# Patient Record
Sex: Male | Born: 1950 | Race: White | Hispanic: No | Marital: Married
Health system: Southern US, Community
[De-identification: ages and names within clinical notes are randomized; demographics above are authoritative.]

---

## 2001-05-24 ENCOUNTER — Encounter: Admission: RE | Admit: 2001-05-24 | Discharge: 2001-05-24 | Payer: Self-pay

## 2004-07-07 ENCOUNTER — Emergency Department: Payer: Self-pay | Admitting: Emergency Medicine

## 2011-06-27 ENCOUNTER — Inpatient Hospital Stay: Payer: Self-pay | Admitting: Internal Medicine

## 2011-09-18 LAB — COMPREHENSIVE METABOLIC PANEL
Albumin: 3.8 g/dL (ref 3.4–5.0)
Alkaline Phosphatase: 87 U/L (ref 50–136)
Anion Gap: 13 (ref 7–16)
BUN: 21 mg/dL — ABNORMAL HIGH (ref 7–18)
Glucose: 251 mg/dL — ABNORMAL HIGH (ref 65–99)
Osmolality: 282 (ref 275–301)
Potassium: 4 mmol/L (ref 3.5–5.1)
SGOT(AST): 17 U/L (ref 15–37)
Sodium: 135 mmol/L — ABNORMAL LOW (ref 136–145)
Total Protein: 8.2 g/dL (ref 6.4–8.2)

## 2011-09-18 LAB — CBC
HGB: 12.8 g/dL — ABNORMAL LOW (ref 13.0–18.0)
MCH: 28.4 pg (ref 26.0–34.0)
MCHC: 32.4 g/dL (ref 32.0–36.0)
RBC: 4.48 10*6/uL (ref 4.40–5.90)
RDW: 14.1 % (ref 11.5–14.5)
WBC: 10.7 10*3/uL — ABNORMAL HIGH (ref 3.8–10.6)

## 2011-09-19 ENCOUNTER — Inpatient Hospital Stay: Payer: Self-pay | Admitting: Student

## 2011-09-19 LAB — PROTIME-INR
INR: 1.2
Prothrombin Time: 15.1 secs — ABNORMAL HIGH (ref 11.5–14.7)

## 2011-09-19 LAB — URINALYSIS, COMPLETE
Bilirubin,UR: NEGATIVE
Blood: NEGATIVE
Glucose,UR: >=500 mg/dL (ref 0–75)
Ketone: NEGATIVE
Ph: 5 (ref 4.5–8.0)
Squamous Epithelial: 6
WBC UR: 5 /HPF (ref 0–5)

## 2011-09-19 LAB — TSH: Thyroid Stimulating Horm: 1.09 u[IU]/mL

## 2011-09-19 LAB — CK TOTAL AND CKMB (NOT AT ARMC)
CK, Total: 81 U/L (ref 35–232)
CK-MB: 0.9 ng/mL (ref 0.5–3.6)

## 2011-09-19 LAB — PRO B NATRIURETIC PEPTIDE: B-Type Natriuretic Peptide: 1624 pg/mL — ABNORMAL HIGH (ref 0–125)

## 2011-09-19 LAB — TROPONIN I: Troponin-I: 0.03 ng/mL

## 2011-09-20 LAB — CBC WITH DIFFERENTIAL/PLATELET
Basophil %: 0.1 %
Eosinophil %: 0.4 %
HCT: 35.3 % — ABNORMAL LOW (ref 40.0–52.0)
HGB: 11.8 g/dL — ABNORMAL LOW (ref 13.0–18.0)
Lymphocyte #: 0.6 10*3/uL — ABNORMAL LOW (ref 1.0–3.6)
MCH: 29.5 pg (ref 26.0–34.0)
MCV: 88 fL (ref 80–100)
Monocyte #: 0.9 10*3/uL — ABNORMAL HIGH (ref 0.0–0.7)
Monocyte %: 4.7 %
Neutrophil %: 91.9 %
RBC: 3.99 10*6/uL — ABNORMAL LOW (ref 4.40–5.90)

## 2011-09-20 LAB — BASIC METABOLIC PANEL
Calcium, Total: 9.1 mg/dL (ref 8.5–10.1)
Chloride: 95 mmol/L — ABNORMAL LOW (ref 98–107)
EGFR (African American): 48 — ABNORMAL LOW
Glucose: 86 mg/dL (ref 65–99)
Potassium: 4 mmol/L (ref 3.5–5.1)
Sodium: 132 mmol/L — ABNORMAL LOW (ref 136–145)

## 2011-09-21 LAB — VANCOMYCIN, TROUGH: Vancomycin, Trough: 18 ug/mL (ref 10–20)

## 2011-09-21 LAB — CBC WITH DIFFERENTIAL/PLATELET
Eosinophil #: 0.1 10*3/uL (ref 0.0–0.7)
Eosinophil %: 0.8 %
HCT: 34.7 % — ABNORMAL LOW (ref 40.0–52.0)
Lymphocyte #: 0.6 10*3/uL — ABNORMAL LOW (ref 1.0–3.6)
Lymphocyte %: 4.1 %
MCHC: 32.9 g/dL (ref 32.0–36.0)
Monocyte #: 0.8 10*3/uL — ABNORMAL HIGH (ref 0.0–0.7)
Monocyte %: 4.9 %
Neutrophil %: 90.1 %
RDW: 14.9 % — ABNORMAL HIGH (ref 11.5–14.5)
WBC: 15.5 10*3/uL — ABNORMAL HIGH (ref 3.8–10.6)

## 2011-09-21 LAB — BASIC METABOLIC PANEL
Anion Gap: 12 (ref 7–16)
BUN: 41 mg/dL — ABNORMAL HIGH (ref 7–18)
Chloride: 95 mmol/L — ABNORMAL LOW (ref 98–107)
Creatinine: 1.32 mg/dL — ABNORMAL HIGH (ref 0.60–1.30)
EGFR (Non-African Amer.): 59 — ABNORMAL LOW
Potassium: 4.1 mmol/L (ref 3.5–5.1)

## 2011-09-21 LAB — HEMOGLOBIN: HGB: 7.5 g/dL — ABNORMAL LOW (ref 13.0–18.0)

## 2011-09-21 LAB — OCCULT BLOOD X 1 CARD TO LAB, STOOL: Occult Blood, Feces: NEGATIVE

## 2011-09-22 LAB — BASIC METABOLIC PANEL
BUN: 34 mg/dL — ABNORMAL HIGH (ref 7–18)
Creatinine: 1.03 mg/dL (ref 0.60–1.30)
EGFR (African American): >60
EGFR (Non-African Amer.): >60
Glucose: 222 mg/dL — ABNORMAL HIGH (ref 65–99)
Potassium: 4.7 mmol/L (ref 3.5–5.1)
Sodium: 133 mmol/L — ABNORMAL LOW (ref 136–145)

## 2011-09-22 LAB — CBC WITH DIFFERENTIAL/PLATELET
Basophil %: 0.3 %
Eosinophil #: 0.1 10*3/uL (ref 0.0–0.7)
Eosinophil %: 0.5 %
HCT: 19.9 % — ABNORMAL LOW (ref 40.0–52.0)
HGB: 6.6 g/dL — ABNORMAL LOW (ref 13.0–18.0)
MCH: 29.1 pg (ref 26.0–34.0)
MCHC: 33.1 g/dL (ref 32.0–36.0)
MCV: 88 fL (ref 80–100)
Neutrophil #: 11.5 10*3/uL — ABNORMAL HIGH (ref 1.4–6.5)
Platelet: 143 10*3/uL — ABNORMAL LOW (ref 150–440)
RBC: 2.26 10*6/uL — ABNORMAL LOW (ref 4.40–5.90)

## 2011-09-22 LAB — PROTIME-INR: Prothrombin Time: 16.1 secs — ABNORMAL HIGH (ref 11.5–14.7)

## 2011-09-23 LAB — CBC WITH DIFFERENTIAL/PLATELET
Basophil %: 0.6 %
Basophil %: 0.5 %
Eosinophil #: 0.2 10*3/uL (ref 0.0–0.7)
Eosinophil #: 0.2 10*3/uL (ref 0.0–0.7)
Eosinophil %: 1.3 %
Eosinophil %: 1.6 %
HCT: 21 % — ABNORMAL LOW (ref 40.0–52.0)
HGB: 7 g/dL — ABNORMAL LOW (ref 13.0–18.0)
HGB: 7.7 g/dL — ABNORMAL LOW (ref 13.0–18.0)
Lymphocyte #: 1.1 10*3/uL (ref 1.0–3.6)
Lymphocyte %: 9 %
Lymphocyte %: 7.6 %
MCH: 29.4 pg (ref 26.0–34.0)
MCHC: 33.3 g/dL (ref 32.0–36.0)
MCV: 88 fL (ref 80–100)
Monocyte #: 1 10*3/uL — ABNORMAL HIGH (ref 0.0–0.7)
Monocyte %: 7.7 %
Neutrophil #: 12.1 10*3/uL — ABNORMAL HIGH (ref 1.4–6.5)
Neutrophil %: 81.4 %
Neutrophil %: 82.2 %
Platelet: 169 10*3/uL (ref 150–440)
RBC: 2.38 10*6/uL — ABNORMAL LOW (ref 4.40–5.90)
RBC: 2.59 10*6/uL — ABNORMAL LOW (ref 4.40–5.90)
RDW: 15.3 % — ABNORMAL HIGH (ref 11.5–14.5)
WBC: 13.3 10*3/uL — ABNORMAL HIGH (ref 3.8–10.6)

## 2011-09-23 LAB — VANCOMYCIN, TROUGH: Vancomycin, Trough: 21 ug/mL (ref 10–20)

## 2011-09-23 LAB — BASIC METABOLIC PANEL
Anion Gap: 12 (ref 7–16)
Calcium, Total: 7.7 mg/dL — ABNORMAL LOW (ref 8.5–10.1)
Chloride: 101 mmol/L (ref 98–107)
Co2: 26 mmol/L (ref 21–32)
EGFR (Non-African Amer.): >60
Glucose: 232 mg/dL — ABNORMAL HIGH (ref 65–99)
Sodium: 139 mmol/L (ref 136–145)

## 2011-09-24 LAB — CBC WITH DIFFERENTIAL/PLATELET
Basophil %: 0.7 %
Eosinophil %: 2.7 %
HCT: 23 % — ABNORMAL LOW (ref 40.0–52.0)
HGB: 7.7 g/dL — ABNORMAL LOW (ref 13.0–18.0)
Lymphocyte %: 11.5 %
MCV: 87 fL (ref 80–100)
Neutrophil #: 10.1 10*3/uL — ABNORMAL HIGH (ref 1.4–6.5)
Neutrophil %: 77.6 %
RDW: 15.7 % — ABNORMAL HIGH (ref 11.5–14.5)

## 2011-09-24 LAB — CULTURE, BLOOD (SINGLE)

## 2011-09-25 LAB — CBC WITH DIFFERENTIAL/PLATELET
Basophil #: 0.1 10*3/uL (ref 0.0–0.1)
Eosinophil #: 0.4 10*3/uL (ref 0.0–0.7)
HCT: 23.9 % — ABNORMAL LOW (ref 40.0–52.0)
Lymphocyte #: 1.2 10*3/uL (ref 1.0–3.6)
Lymphocyte %: 10.3 %
MCHC: 33.7 g/dL (ref 32.0–36.0)
MCV: 88 fL (ref 80–100)
Neutrophil #: 9.3 10*3/uL — ABNORMAL HIGH (ref 1.4–6.5)
RDW: 15.8 % — ABNORMAL HIGH (ref 11.5–14.5)

## 2011-09-26 LAB — CBC WITH DIFFERENTIAL/PLATELET
Basophil %: 0.4 %
Eosinophil #: 0.4 10*3/uL (ref 0.0–0.7)
Eosinophil %: 3 %
HCT: 25.2 % — ABNORMAL LOW (ref 40.0–52.0)
HGB: 8.4 g/dL — ABNORMAL LOW (ref 13.0–18.0)
Lymphocyte %: 10.2 %
MCH: 29.5 pg (ref 26.0–34.0)
MCHC: 33.3 g/dL (ref 32.0–36.0)
MCV: 88 fL (ref 80–100)
Monocyte #: 0.9 10*3/uL — ABNORMAL HIGH (ref 0.0–0.7)
Neutrophil #: 11.9 10*3/uL — ABNORMAL HIGH (ref 1.4–6.5)
Platelet: 262 10*3/uL (ref 150–440)
RBC: 2.85 10*6/uL — ABNORMAL LOW (ref 4.40–5.90)

## 2011-12-08 ENCOUNTER — Encounter: Payer: Self-pay | Admitting: Family Medicine

## 2011-12-10 ENCOUNTER — Ambulatory Visit: Payer: Self-pay | Admitting: Family Medicine

## 2011-12-17 ENCOUNTER — Inpatient Hospital Stay: Payer: Self-pay | Admitting: Internal Medicine

## 2011-12-17 LAB — COMPREHENSIVE METABOLIC PANEL
Albumin: 3.2 g/dL — ABNORMAL LOW (ref 3.4–5.0)
Alkaline Phosphatase: 103 U/L (ref 50–136)
Anion Gap: 8 (ref 7–16)
BUN: 15 mg/dL (ref 7–18)
Bilirubin,Total: 0.4 mg/dL (ref 0.2–1.0)
Calcium, Total: 9.1 mg/dL (ref 8.5–10.1)
Chloride: 99 mmol/L (ref 98–107)
Co2: 29 mmol/L (ref 21–32)
Creatinine: 0.85 mg/dL (ref 0.60–1.30)
EGFR (African American): >60
EGFR (Non-African Amer.): >60
Glucose: 99 mg/dL (ref 65–99)
Osmolality: 273 (ref 275–301)
Potassium: 4.1 mmol/L (ref 3.5–5.1)
SGOT(AST): 16 U/L (ref 15–37)
SGPT (ALT): 17 U/L
Sodium: 136 mmol/L (ref 136–145)
Total Protein: 7.9 g/dL (ref 6.4–8.2)

## 2011-12-17 LAB — CK TOTAL AND CKMB (NOT AT ARMC)
CK, Total: 49 U/L (ref 35–232)
CK-MB: 1.1 ng/mL (ref 0.5–3.6)

## 2011-12-17 LAB — CBC
HCT: 31.7 % — ABNORMAL LOW (ref 40.0–52.0)
HGB: 9.9 g/dL — ABNORMAL LOW (ref 13.0–18.0)
MCH: 23.6 pg — ABNORMAL LOW (ref 26.0–34.0)
MCHC: 31.4 g/dL — ABNORMAL LOW (ref 32.0–36.0)
MCV: 75 fL — ABNORMAL LOW (ref 80–100)
Platelet: 392 10*3/uL (ref 150–440)
RBC: 4.2 10*6/uL — ABNORMAL LOW (ref 4.40–5.90)
RDW: 15.8 % — ABNORMAL HIGH (ref 11.5–14.5)
WBC: 8.6 10*3/uL (ref 3.8–10.6)

## 2011-12-17 LAB — PRO B NATRIURETIC PEPTIDE: B-Type Natriuretic Peptide: 740 pg/mL — ABNORMAL HIGH (ref 0–125)

## 2011-12-17 LAB — TROPONIN I: Troponin-I: <0.02 ng/mL

## 2011-12-18 LAB — CBC WITH DIFFERENTIAL/PLATELET
Basophil #: 0.1 10*3/uL (ref 0.0–0.1)
Basophil %: 1.1 %
Eosinophil #: 0.3 10*3/uL (ref 0.0–0.7)
Eosinophil %: 3.6 %
HCT: 30.8 % — ABNORMAL LOW (ref 40.0–52.0)
HGB: 9.8 g/dL — ABNORMAL LOW (ref 13.0–18.0)
Lymphocyte #: 1.8 10*3/uL (ref 1.0–3.6)
Lymphocyte %: 22.5 %
MCHC: 31.9 g/dL — ABNORMAL LOW (ref 32.0–36.0)
MCV: 76 fL — ABNORMAL LOW (ref 80–100)
Monocyte #: 1.2 x10 3/mm — ABNORMAL HIGH (ref 0.2–1.0)
Neutrophil #: 4.5 10*3/uL (ref 1.4–6.5)
RBC: 4.08 10*6/uL — ABNORMAL LOW (ref 4.40–5.90)

## 2011-12-23 LAB — CULTURE, BLOOD (SINGLE)

## 2011-12-24 ENCOUNTER — Inpatient Hospital Stay: Payer: Self-pay | Admitting: Specialist

## 2011-12-24 LAB — CBC WITH DIFFERENTIAL/PLATELET
Basophil #: 0.1 10*3/uL (ref 0.0–0.1)
Eosinophil %: 2 %
HCT: 31.9 % — ABNORMAL LOW (ref 40.0–52.0)
Lymphocyte #: 1.4 10*3/uL (ref 1.0–3.6)
Lymphocyte %: 13.4 %
MCV: 75 fL — ABNORMAL LOW (ref 80–100)
Monocyte #: 0.9 x10 3/mm (ref 0.2–1.0)
Neutrophil #: 7.5 10*3/uL — ABNORMAL HIGH (ref 1.4–6.5)
RBC: 4.27 10*6/uL — ABNORMAL LOW (ref 4.40–5.90)
RDW: 15.9 % — ABNORMAL HIGH (ref 11.5–14.5)
WBC: 10.1 10*3/uL (ref 3.8–10.6)

## 2011-12-24 LAB — BASIC METABOLIC PANEL
Anion Gap: 9 (ref 7–16)
BUN: 20 mg/dL — ABNORMAL HIGH (ref 7–18)
Calcium, Total: 9.2 mg/dL (ref 8.5–10.1)
Chloride: 94 mmol/L — ABNORMAL LOW (ref 98–107)
Co2: 29 mmol/L (ref 21–32)
Glucose: 111 mg/dL — ABNORMAL HIGH (ref 65–99)
Osmolality: 268 (ref 275–301)
Potassium: 4.4 mmol/L (ref 3.5–5.1)
Sodium: 132 mmol/L — ABNORMAL LOW (ref 136–145)

## 2011-12-25 LAB — BASIC METABOLIC PANEL
Anion Gap: 10 (ref 7–16)
Calcium, Total: 9.3 mg/dL (ref 8.5–10.1)
Chloride: 93 mmol/L — ABNORMAL LOW (ref 98–107)
Creatinine: 0.92 mg/dL (ref 0.60–1.30)
Glucose: 187 mg/dL — ABNORMAL HIGH (ref 65–99)
Osmolality: 269 (ref 275–301)
Potassium: 4.4 mmol/L (ref 3.5–5.1)
Sodium: 131 mmol/L — ABNORMAL LOW (ref 136–145)

## 2011-12-25 LAB — CBC WITH DIFFERENTIAL/PLATELET
Basophil #: 0.1 10*3/uL (ref 0.0–0.1)
Basophil %: 0.9 %
Lymphocyte %: 4.2 %
MCH: 24 pg — ABNORMAL LOW (ref 26.0–34.0)
Monocyte #: 0.2 x10 3/mm (ref 0.2–1.0)
Neutrophil #: 7.5 10*3/uL — ABNORMAL HIGH (ref 1.4–6.5)
Platelet: 340 10*3/uL (ref 150–440)
RDW: 15.7 % — ABNORMAL HIGH (ref 11.5–14.5)
WBC: 8.1 10*3/uL (ref 3.8–10.6)

## 2011-12-30 LAB — CULTURE, BLOOD (SINGLE)

## 2012-01-05 ENCOUNTER — Encounter: Payer: Self-pay | Admitting: Family Medicine

## 2012-01-20 ENCOUNTER — Inpatient Hospital Stay: Payer: Self-pay | Admitting: Internal Medicine

## 2012-01-20 LAB — URINALYSIS, COMPLETE
Bacteria: NONE SEEN
Bilirubin,UR: NEGATIVE
Blood: NEGATIVE
Glucose,UR: NEGATIVE mg/dL (ref 0–75)
Ketone: NEGATIVE
Leukocyte Esterase: NEGATIVE
Nitrite: NEGATIVE
Ph: 5 (ref 4.5–8.0)
Protein: NEGATIVE
RBC,UR: NONE SEEN /HPF (ref 0–5)
Specific Gravity: 1.01 (ref 1.003–1.030)
Squamous Epithelial: <1
WBC UR: <1 /HPF (ref 0–5)

## 2012-01-20 LAB — COMPREHENSIVE METABOLIC PANEL
Albumin: 3.2 g/dL — ABNORMAL LOW (ref 3.4–5.0)
Alkaline Phosphatase: 88 U/L (ref 50–136)
Anion Gap: 12 (ref 7–16)
BUN: 41 mg/dL — ABNORMAL HIGH (ref 7–18)
Bilirubin,Total: 0.7 mg/dL (ref 0.2–1.0)
Calcium, Total: 9.5 mg/dL (ref 8.5–10.1)
Chloride: 94 mmol/L — ABNORMAL LOW (ref 98–107)
Co2: 26 mmol/L (ref 21–32)
Creatinine: 1.41 mg/dL — ABNORMAL HIGH (ref 0.60–1.30)
EGFR (African American): >60
EGFR (Non-African Amer.): 53 — ABNORMAL LOW
Glucose: 187 mg/dL — ABNORMAL HIGH (ref 65–99)
Osmolality: 280 (ref 275–301)
Potassium: 4.8 mmol/L (ref 3.5–5.1)
SGOT(AST): 15 U/L (ref 15–37)
SGPT (ALT): 19 U/L
Sodium: 132 mmol/L — ABNORMAL LOW (ref 136–145)
Total Protein: 7.7 g/dL (ref 6.4–8.2)

## 2012-01-20 LAB — CBC
HCT: 30.9 % — ABNORMAL LOW (ref 40.0–52.0)
HGB: 9.4 g/dL — ABNORMAL LOW (ref 13.0–18.0)
MCH: 22.7 pg — ABNORMAL LOW (ref 26.0–34.0)
MCHC: 30.4 g/dL — ABNORMAL LOW (ref 32.0–36.0)
MCV: 75 fL — ABNORMAL LOW (ref 80–100)
Platelet: 284 10*3/uL (ref 150–440)
RBC: 4.13 10*6/uL — ABNORMAL LOW (ref 4.40–5.90)
RDW: 18.2 % — ABNORMAL HIGH (ref 11.5–14.5)
WBC: 7.9 10*3/uL (ref 3.8–10.6)

## 2012-01-20 LAB — PROTIME-INR
INR: 1.1
Prothrombin Time: 14.4 secs (ref 11.5–14.7)

## 2012-01-20 LAB — MAGNESIUM: Magnesium: 1.9 mg/dL

## 2012-01-21 LAB — BASIC METABOLIC PANEL
Anion Gap: 9 (ref 7–16)
BUN: 34 mg/dL — ABNORMAL HIGH (ref 7–18)
Creatinine: 1.26 mg/dL (ref 0.60–1.30)
EGFR (Non-African Amer.): >60
Glucose: 94 mg/dL (ref 65–99)
Osmolality: 277 (ref 275–301)

## 2012-01-21 LAB — CBC WITH DIFFERENTIAL/PLATELET
Basophil #: 0.1 10*3/uL (ref 0.0–0.1)
Eosinophil %: 3.9 %
HCT: 30.6 % — ABNORMAL LOW (ref 40.0–52.0)
HGB: 9.6 g/dL — ABNORMAL LOW (ref 13.0–18.0)
Lymphocyte %: 13.5 %
MCV: 74 fL — ABNORMAL LOW (ref 80–100)
Monocyte %: 11.8 %
Neutrophil #: 5.4 10*3/uL (ref 1.4–6.5)
Neutrophil %: 69.7 %
RBC: 4.13 10*6/uL — ABNORMAL LOW (ref 4.40–5.90)
RDW: 18.3 % — ABNORMAL HIGH (ref 11.5–14.5)

## 2012-01-24 LAB — WOUND CULTURE

## 2012-01-26 LAB — CULTURE, BLOOD (SINGLE)

## 2012-02-05 ENCOUNTER — Encounter: Payer: Self-pay | Admitting: Family Medicine

## 2012-12-05 ENCOUNTER — Ambulatory Visit: Payer: Self-pay | Admitting: Internal Medicine

## 2012-12-08 ENCOUNTER — Ambulatory Visit: Payer: Self-pay | Admitting: Pain Medicine

## 2012-12-23 ENCOUNTER — Inpatient Hospital Stay: Payer: Self-pay | Admitting: Internal Medicine

## 2012-12-23 LAB — URINALYSIS, COMPLETE
Bilirubin,UR: NEGATIVE
Blood: NEGATIVE
Glucose,UR: NEGATIVE mg/dL (ref 0–75)
Ketone: NEGATIVE
Nitrite: NEGATIVE
Ph: 5 (ref 4.5–8.0)
Protein: 30
RBC,UR: 4 /HPF (ref 0–5)
Specific Gravity: 1.018 (ref 1.003–1.030)
WBC UR: 43 /HPF (ref 0–5)

## 2012-12-23 LAB — COMPREHENSIVE METABOLIC PANEL
Alkaline Phosphatase: 116 U/L (ref 50–136)
Anion Gap: 7 (ref 7–16)
Bilirubin,Total: 0.5 mg/dL (ref 0.2–1.0)
Chloride: 106 mmol/L (ref 98–107)
EGFR (African American): 18 — ABNORMAL LOW
EGFR (Non-African Amer.): 16 — ABNORMAL LOW
Glucose: 86 mg/dL (ref 65–99)
Osmolality: 295 (ref 275–301)
Potassium: 5.8 mmol/L — ABNORMAL HIGH (ref 3.5–5.1)
SGOT(AST): 21 U/L (ref 15–37)
SGPT (ALT): 14 U/L (ref 12–78)
Total Protein: 7.5 g/dL (ref 6.4–8.2)

## 2012-12-23 LAB — TROPONIN I: Troponin-I: 0.09 ng/mL — ABNORMAL HIGH

## 2012-12-23 LAB — AMMONIA: Ammonia, Plasma: 104 mcmol/L — ABNORMAL HIGH (ref 11–32)

## 2012-12-23 LAB — CBC
HCT: 35.4 % — ABNORMAL LOW (ref 40.0–52.0)
HGB: 11.7 g/dL — ABNORMAL LOW (ref 13.0–18.0)
Platelet: 158 10*3/uL (ref 150–440)
RBC: 4.05 10*6/uL — ABNORMAL LOW (ref 4.40–5.90)
RDW: 17.4 % — ABNORMAL HIGH (ref 11.5–14.5)
WBC: 6.7 10*3/uL (ref 3.8–10.6)

## 2012-12-23 LAB — CK TOTAL AND CKMB (NOT AT ARMC): CK, Total: 54 U/L (ref 35–232)

## 2012-12-24 DIAGNOSIS — I4891 Unspecified atrial fibrillation: Secondary | ICD-10-CM

## 2012-12-24 DIAGNOSIS — R7989 Other specified abnormal findings of blood chemistry: Secondary | ICD-10-CM

## 2012-12-24 DIAGNOSIS — I359 Nonrheumatic aortic valve disorder, unspecified: Secondary | ICD-10-CM

## 2012-12-24 LAB — LIPID PANEL
HDL Cholesterol: 38 mg/dL — ABNORMAL LOW (ref 40–60)
Ldl Cholesterol, Calc: 56 mg/dL (ref 0–100)

## 2012-12-24 LAB — BASIC METABOLIC PANEL
Anion Gap: 8 (ref 7–16)
BUN: 85 mg/dL — ABNORMAL HIGH (ref 7–18)
Calcium, Total: 8.7 mg/dL (ref 8.5–10.1)
Co2: 24 mmol/L (ref 21–32)
Creatinine: 4.23 mg/dL — ABNORMAL HIGH (ref 0.60–1.30)
EGFR (African American): 17 — ABNORMAL LOW
EGFR (African American): 16 — ABNORMAL LOW
EGFR (Non-African Amer.): 14 — ABNORMAL LOW
Glucose: 180 mg/dL — ABNORMAL HIGH (ref 65–99)
Osmolality: 299 (ref 275–301)
Osmolality: 296 (ref 275–301)
Sodium: 135 mmol/L — ABNORMAL LOW (ref 136–145)
Sodium: 132 mmol/L — ABNORMAL LOW (ref 136–145)

## 2012-12-24 LAB — CBC WITH DIFFERENTIAL/PLATELET
Basophil #: 0.1 10*3/uL (ref 0.0–0.1)
Basophil %: 1.3 %
Eosinophil #: 0.2 10*3/uL (ref 0.0–0.7)
HGB: 11.2 g/dL — ABNORMAL LOW (ref 13.0–18.0)
Lymphocyte %: 11.8 %
MCH: 28.5 pg (ref 26.0–34.0)
MCHC: 32.5 g/dL (ref 32.0–36.0)
MCV: 88 fL (ref 80–100)
Neutrophil #: 3.6 10*3/uL (ref 1.4–6.5)
Neutrophil %: 66.4 %
RBC: 3.93 10*6/uL — ABNORMAL LOW (ref 4.40–5.90)
RDW: 17 % — ABNORMAL HIGH (ref 11.5–14.5)

## 2012-12-24 LAB — HEMOGLOBIN A1C: Hemoglobin A1C: 6.4 % — ABNORMAL HIGH (ref 4.2–6.3)

## 2012-12-24 LAB — CK TOTAL AND CKMB (NOT AT ARMC)
CK, Total: 54 U/L (ref 35–232)
CK-MB: 4.3 ng/mL — ABNORMAL HIGH (ref 0.5–3.6)

## 2012-12-24 LAB — TROPONIN I: Troponin-I: 0.06 ng/mL — ABNORMAL HIGH

## 2012-12-24 LAB — MAGNESIUM: Magnesium: 2 mg/dL

## 2012-12-25 LAB — CBC WITH DIFFERENTIAL/PLATELET
Basophil #: 0.1 10*3/uL (ref 0.0–0.1)
Eosinophil #: 0.4 10*3/uL (ref 0.0–0.7)
Lymphocyte #: 0.4 10*3/uL — ABNORMAL LOW (ref 1.0–3.6)
Lymphocyte %: 5.3 %
MCHC: 33 g/dL (ref 32.0–36.0)
MCV: 86 fL (ref 80–100)
Monocyte #: 1 x10 3/mm (ref 0.2–1.0)
Monocyte %: 13.2 %
Neutrophil #: 5.6 10*3/uL (ref 1.4–6.5)
RBC: 3.97 10*6/uL — ABNORMAL LOW (ref 4.40–5.90)
RDW: 16.9 % — ABNORMAL HIGH (ref 11.5–14.5)

## 2012-12-25 LAB — BASIC METABOLIC PANEL
Anion Gap: 9 (ref 7–16)
BUN: 92 mg/dL — ABNORMAL HIGH (ref 7–18)
Calcium, Total: 8.6 mg/dL (ref 8.5–10.1)
Creatinine: 4.07 mg/dL — ABNORMAL HIGH (ref 0.60–1.30)
EGFR (Non-African Amer.): 15 — ABNORMAL LOW
Osmolality: 309 (ref 275–301)
Sodium: 137 mmol/L (ref 136–145)

## 2012-12-25 LAB — HEPATIC FUNCTION PANEL A (ARMC)
Alkaline Phosphatase: 96 U/L (ref 50–136)
Bilirubin, Direct: 0.4 mg/dL — ABNORMAL HIGH (ref 0.00–0.20)
Bilirubin,Total: 0.7 mg/dL (ref 0.2–1.0)
SGOT(AST): 14 U/L — ABNORMAL LOW (ref 15–37)
SGPT (ALT): 13 U/L (ref 12–78)

## 2012-12-25 LAB — TROPONIN I: Troponin-I: 0.06 ng/mL — ABNORMAL HIGH

## 2012-12-25 LAB — URINE CULTURE

## 2012-12-26 LAB — AMMONIA: Ammonia, Plasma: 34 mcmol/L — ABNORMAL HIGH (ref 11–32)

## 2012-12-26 LAB — COMPREHENSIVE METABOLIC PANEL
Alkaline Phosphatase: 83 U/L (ref 50–136)
Anion Gap: 13 (ref 7–16)
BUN: 91 mg/dL — ABNORMAL HIGH (ref 7–18)
Bilirubin,Total: 0.9 mg/dL (ref 0.2–1.0)
Chloride: 104 mmol/L (ref 98–107)
Co2: 22 mmol/L (ref 21–32)
Creatinine: 3.78 mg/dL — ABNORMAL HIGH (ref 0.60–1.30)
EGFR (African American): 19 — ABNORMAL LOW
Osmolality: 311 (ref 275–301)
Potassium: 5 mmol/L (ref 3.5–5.1)
SGOT(AST): 15 U/L (ref 15–37)
Sodium: 139 mmol/L (ref 136–145)
Total Protein: 6.3 g/dL — ABNORMAL LOW (ref 6.4–8.2)

## 2012-12-26 LAB — CBC WITH DIFFERENTIAL/PLATELET
Basophil #: 0.1 10*3/uL (ref 0.0–0.1)
Basophil %: 1 %
Eosinophil #: 0.5 10*3/uL (ref 0.0–0.7)
Eosinophil %: 5.8 %
HCT: 34.3 % — ABNORMAL LOW (ref 40.0–52.0)
HGB: 11.1 g/dL — ABNORMAL LOW (ref 13.0–18.0)
Lymphocyte %: 4.7 %
MCHC: 32.4 g/dL (ref 32.0–36.0)
MCV: 85 fL (ref 80–100)
Monocyte #: 0.9 x10 3/mm (ref 0.2–1.0)
Monocyte %: 10.8 %
Neutrophil %: 77.7 %
Platelet: 151 10*3/uL (ref 150–440)
RDW: 17 % — ABNORMAL HIGH (ref 11.5–14.5)

## 2012-12-26 LAB — MAGNESIUM: Magnesium: 1.6 mg/dL — ABNORMAL LOW

## 2012-12-26 LAB — PHOSPHORUS: Phosphorus: 6.9 mg/dL — ABNORMAL HIGH (ref 2.5–4.9)

## 2012-12-27 LAB — CBC WITH DIFFERENTIAL/PLATELET
Basophil #: 0 10*3/uL (ref 0.0–0.1)
Basophil %: 0.5 %
Eosinophil #: 0.4 10*3/uL (ref 0.0–0.7)
Eosinophil %: 5.2 %
HCT: 30.3 % — ABNORMAL LOW (ref 40.0–52.0)
HGB: 10.3 g/dL — ABNORMAL LOW (ref 13.0–18.0)
Lymphocyte #: 0.6 10*3/uL — ABNORMAL LOW (ref 1.0–3.6)
Lymphocyte %: 7.4 %
MCH: 29.1 pg (ref 26.0–34.0)
Monocyte #: 0.9 x10 3/mm (ref 0.2–1.0)
Monocyte %: 12 %
Neutrophil %: 74.9 %
Platelet: 134 10*3/uL — ABNORMAL LOW (ref 150–440)
RDW: 17.2 % — ABNORMAL HIGH (ref 11.5–14.5)
WBC: 7.5 10*3/uL (ref 3.8–10.6)

## 2012-12-27 LAB — PROTEIN / CREATININE RATIO, URINE
Creatinine, Urine: 95.5 mg/dL (ref 30.0–125.0)
Protein, Random Urine: 26 mg/dL — ABNORMAL HIGH (ref 0–12)

## 2012-12-27 LAB — BASIC METABOLIC PANEL
Anion Gap: 9 (ref 7–16)
BUN: 91 mg/dL — ABNORMAL HIGH (ref 7–18)
Calcium, Total: 8.2 mg/dL — ABNORMAL LOW (ref 8.5–10.1)
Chloride: 106 mmol/L (ref 98–107)
EGFR (African American): 21 — ABNORMAL LOW
Glucose: 176 mg/dL — ABNORMAL HIGH (ref 65–99)
Osmolality: 306 (ref 275–301)
Potassium: 4.8 mmol/L (ref 3.5–5.1)
Sodium: 137 mmol/L (ref 136–145)

## 2012-12-28 LAB — PROTEIN ELECTROPHORESIS(ARMC)

## 2012-12-28 LAB — CBC WITH DIFFERENTIAL/PLATELET
Basophil #: 0 10*3/uL (ref 0.0–0.1)
Basophil %: 0.6 %
Eosinophil #: 0.6 10*3/uL (ref 0.0–0.7)
Eosinophil %: 7 %
HCT: 32.1 % — ABNORMAL LOW (ref 40.0–52.0)
MCH: 29 pg (ref 26.0–34.0)
MCHC: 34.1 g/dL (ref 32.0–36.0)
MCV: 85 fL (ref 80–100)
Monocyte #: 1 x10 3/mm (ref 0.2–1.0)
Monocyte %: 12 %
Neutrophil #: 5.8 10*3/uL (ref 1.4–6.5)
Neutrophil %: 73.8 %
Platelet: 142 10*3/uL — ABNORMAL LOW (ref 150–440)
RBC: 3.78 10*6/uL — ABNORMAL LOW (ref 4.40–5.90)

## 2012-12-28 LAB — MAGNESIUM: Magnesium: 2.1 mg/dL

## 2012-12-28 LAB — PHOSPHORUS: Phosphorus: 4.8 mg/dL (ref 2.5–4.9)

## 2012-12-28 LAB — BASIC METABOLIC PANEL
Anion Gap: 10 (ref 7–16)
Chloride: 107 mmol/L (ref 98–107)
Creatinine: 2.82 mg/dL — ABNORMAL HIGH (ref 0.60–1.30)
EGFR (African American): 27 — ABNORMAL LOW
EGFR (Non-African Amer.): 23 — ABNORMAL LOW
Glucose: 142 mg/dL — ABNORMAL HIGH (ref 65–99)
Osmolality: 306 (ref 275–301)
Potassium: 4.5 mmol/L (ref 3.5–5.1)

## 2012-12-29 LAB — BASIC METABOLIC PANEL
BUN: 87 mg/dL — ABNORMAL HIGH (ref 7–18)
Calcium, Total: 9.1 mg/dL (ref 8.5–10.1)
Chloride: 108 mmol/L — ABNORMAL HIGH (ref 98–107)
Creatinine: 2.67 mg/dL — ABNORMAL HIGH (ref 0.60–1.30)
EGFR (African American): 28 — ABNORMAL LOW
Glucose: 124 mg/dL — ABNORMAL HIGH (ref 65–99)
Potassium: 4.9 mmol/L (ref 3.5–5.1)
Sodium: 140 mmol/L (ref 136–145)

## 2012-12-29 LAB — CBC WITH DIFFERENTIAL/PLATELET
Basophil %: 0.6 %
Eosinophil #: 0.6 10*3/uL (ref 0.0–0.7)
Eosinophil %: 6 %
HCT: 32.7 % — ABNORMAL LOW (ref 40.0–52.0)
Lymphocyte #: 0.5 10*3/uL — ABNORMAL LOW (ref 1.0–3.6)
Lymphocyte %: 5.5 %
MCHC: 33.9 g/dL (ref 32.0–36.0)
MCV: 86 fL (ref 80–100)
Monocyte #: 1.2 x10 3/mm — ABNORMAL HIGH (ref 0.2–1.0)
Monocyte %: 12.9 %
Neutrophil #: 7 10*3/uL — ABNORMAL HIGH (ref 1.4–6.5)
Platelet: 166 10*3/uL (ref 150–440)
RBC: 3.82 10*6/uL — ABNORMAL LOW (ref 4.40–5.90)
RDW: 17.4 % — ABNORMAL HIGH (ref 11.5–14.5)

## 2012-12-29 LAB — UR PROT ELECTROPHORESIS, URINE RANDOM

## 2012-12-30 LAB — BASIC METABOLIC PANEL
Anion Gap: 4 — ABNORMAL LOW (ref 7–16)
BUN: 78 mg/dL — ABNORMAL HIGH (ref 7–18)
Chloride: 112 mmol/L — ABNORMAL HIGH (ref 98–107)
Co2: 27 mmol/L (ref 21–32)
EGFR (African American): 43 — ABNORMAL LOW
Osmolality: 312 (ref 275–301)
Potassium: 4.8 mmol/L (ref 3.5–5.1)

## 2012-12-30 LAB — AMMONIA: Ammonia, Plasma: <25 mcmol/L (ref 11–32)

## 2012-12-30 LAB — CBC WITH DIFFERENTIAL/PLATELET
Basophil #: 0 10*3/uL (ref 0.0–0.1)
Basophil %: 0.5 %
Eosinophil #: 0.3 10*3/uL (ref 0.0–0.7)
Eosinophil %: 3.1 %
HGB: 11.6 g/dL — ABNORMAL LOW (ref 13.0–18.0)
Lymphocyte #: 0.6 10*3/uL — ABNORMAL LOW (ref 1.0–3.6)
Lymphocyte %: 5.9 %
MCH: 29.1 pg (ref 26.0–34.0)
MCHC: 33.6 g/dL (ref 32.0–36.0)
Neutrophil #: 7.6 10*3/uL — ABNORMAL HIGH (ref 1.4–6.5)
Neutrophil %: 76.6 %
RBC: 4 10*6/uL — ABNORMAL LOW (ref 4.40–5.90)
RDW: 17.2 % — ABNORMAL HIGH (ref 11.5–14.5)
WBC: 10 10*3/uL (ref 3.8–10.6)

## 2012-12-31 LAB — CBC WITH DIFFERENTIAL/PLATELET
Eosinophil #: 0.2 10*3/uL (ref 0.0–0.7)
Eosinophil %: 2.4 %
Lymphocyte #: 0.9 10*3/uL — ABNORMAL LOW (ref 1.0–3.6)
MCH: 28.6 pg (ref 26.0–34.0)
MCV: 87 fL (ref 80–100)
Monocyte #: 1.6 x10 3/mm — ABNORMAL HIGH (ref 0.2–1.0)
Monocyte %: 15.9 %
Neutrophil #: 7.3 10*3/uL — ABNORMAL HIGH (ref 1.4–6.5)
Neutrophil %: 72.4 %
Platelet: 127 10*3/uL — ABNORMAL LOW (ref 150–440)
RBC: 3.82 10*6/uL — ABNORMAL LOW (ref 4.40–5.90)
RDW: 17.6 % — ABNORMAL HIGH (ref 11.5–14.5)

## 2012-12-31 LAB — BASIC METABOLIC PANEL
Anion Gap: 4 — ABNORMAL LOW (ref 7–16)
BUN: 65 mg/dL — ABNORMAL HIGH (ref 7–18)
Calcium, Total: 9.5 mg/dL (ref 8.5–10.1)
Chloride: 116 mmol/L — ABNORMAL HIGH (ref 98–107)
Co2: 28 mmol/L (ref 21–32)
Creatinine: 1.35 mg/dL — ABNORMAL HIGH (ref 0.60–1.30)
EGFR (African American): >60
EGFR (Non-African Amer.): 56 — ABNORMAL LOW
Glucose: 132 mg/dL — ABNORMAL HIGH (ref 65–99)
Osmolality: 315 (ref 275–301)
Potassium: 4.8 mmol/L (ref 3.5–5.1)
Sodium: 148 mmol/L — ABNORMAL HIGH (ref 136–145)

## 2012-12-31 LAB — PHOSPHORUS: Phosphorus: 3.5 mg/dL (ref 2.5–4.9)

## 2013-01-01 LAB — CBC WITH DIFFERENTIAL/PLATELET
Basophil #: 0.1 10*3/uL (ref 0.0–0.1)
Basophil %: 0.7 %
Eosinophil #: 0.2 10*3/uL (ref 0.0–0.7)
Eosinophil %: 1.8 %
HCT: 37 % — ABNORMAL LOW (ref 40.0–52.0)
Lymphocyte #: 0.8 10*3/uL — ABNORMAL LOW (ref 1.0–3.6)
Lymphocyte %: 7.1 %
MCHC: 32.1 g/dL (ref 32.0–36.0)
MCV: 88 fL (ref 80–100)
Monocyte %: 14.4 %
Neutrophil %: 76 %
RBC: 4.22 10*6/uL — ABNORMAL LOW (ref 4.40–5.90)
RDW: 17 % — ABNORMAL HIGH (ref 11.5–14.5)
WBC: 11.3 10*3/uL — ABNORMAL HIGH (ref 3.8–10.6)

## 2013-01-01 LAB — BASIC METABOLIC PANEL
BUN: 63 mg/dL — ABNORMAL HIGH (ref 7–18)
Calcium, Total: 10.2 mg/dL — ABNORMAL HIGH (ref 8.5–10.1)
Co2: 24 mmol/L (ref 21–32)
Creatinine: 1.28 mg/dL (ref 0.60–1.30)
EGFR (African American): >60
Glucose: 134 mg/dL — ABNORMAL HIGH (ref 65–99)
Potassium: 4.8 mmol/L (ref 3.5–5.1)

## 2013-01-01 LAB — PHOSPHORUS: Phosphorus: 3.7 mg/dL (ref 2.5–4.9)

## 2013-01-01 LAB — MAGNESIUM: Magnesium: 2.1 mg/dL

## 2013-01-02 LAB — CBC WITH DIFFERENTIAL/PLATELET
Basophil #: 0.1 10*3/uL (ref 0.0–0.1)
Basophil %: 1.3 %
Eosinophil #: 0.2 10*3/uL (ref 0.0–0.7)
Eosinophil %: 2.4 %
HCT: 35.5 % — ABNORMAL LOW (ref 40.0–52.0)
Lymphocyte %: 9.5 %
MCHC: 33.3 g/dL (ref 32.0–36.0)
MCV: 87 fL (ref 80–100)
Monocyte #: 1.4 x10 3/mm — ABNORMAL HIGH (ref 0.2–1.0)
Monocyte %: 15.2 %
Platelet: 139 10*3/uL — ABNORMAL LOW (ref 150–440)
RBC: 4.06 10*6/uL — ABNORMAL LOW (ref 4.40–5.90)
WBC: 9.5 10*3/uL (ref 3.8–10.6)

## 2013-01-02 LAB — BASIC METABOLIC PANEL
Calcium, Total: 10.2 mg/dL — ABNORMAL HIGH (ref 8.5–10.1)
Chloride: 118 mmol/L — ABNORMAL HIGH (ref 98–107)
Co2: 26 mmol/L (ref 21–32)
Creatinine: 1.47 mg/dL — ABNORMAL HIGH (ref 0.60–1.30)
EGFR (African American): 58 — ABNORMAL LOW
EGFR (Non-African Amer.): 50 — ABNORMAL LOW
Osmolality: 320 (ref 275–301)
Potassium: 4.7 mmol/L (ref 3.5–5.1)
Sodium: 151 mmol/L — ABNORMAL HIGH (ref 136–145)

## 2013-01-03 DIAGNOSIS — R0602 Shortness of breath: Secondary | ICD-10-CM

## 2013-01-03 LAB — BASIC METABOLIC PANEL
BUN: 61 mg/dL — ABNORMAL HIGH (ref 7–18)
Calcium, Total: 10.3 mg/dL — ABNORMAL HIGH (ref 8.5–10.1)
Chloride: 118 mmol/L — ABNORMAL HIGH (ref 98–107)
Co2: 29 mmol/L (ref 21–32)
EGFR (African American): 59 — ABNORMAL LOW
EGFR (Non-African Amer.): 51 — ABNORMAL LOW
Glucose: 158 mg/dL — ABNORMAL HIGH (ref 65–99)
Potassium: 4.6 mmol/L (ref 3.5–5.1)
Sodium: 152 mmol/L — ABNORMAL HIGH (ref 136–145)

## 2013-01-04 ENCOUNTER — Ambulatory Visit: Payer: Self-pay | Admitting: Internal Medicine

## 2013-01-04 DIAGNOSIS — R0602 Shortness of breath: Secondary | ICD-10-CM

## 2013-01-04 LAB — BASIC METABOLIC PANEL
Anion Gap: 5 — ABNORMAL LOW (ref 7–16)
BUN: 60 mg/dL — ABNORMAL HIGH (ref 7–18)
Chloride: 117 mmol/L — ABNORMAL HIGH (ref 98–107)
Co2: 31 mmol/L (ref 21–32)
Creatinine: 1.62 mg/dL — ABNORMAL HIGH (ref 0.60–1.30)
EGFR (Non-African Amer.): 45 — ABNORMAL LOW
Osmolality: 324 (ref 275–301)
Potassium: 4.6 mmol/L (ref 3.5–5.1)

## 2013-01-05 LAB — BASIC METABOLIC PANEL
Anion Gap: 1 — ABNORMAL LOW (ref 7–16)
BUN: 62 mg/dL — ABNORMAL HIGH (ref 7–18)
Co2: 34 mmol/L — ABNORMAL HIGH (ref 21–32)
EGFR (Non-African Amer.): 34 — ABNORMAL LOW
Glucose: 196 mg/dL — ABNORMAL HIGH (ref 65–99)
Osmolality: 319 (ref 275–301)
Potassium: 4.4 mmol/L (ref 3.5–5.1)

## 2013-01-06 LAB — BASIC METABOLIC PANEL
Anion Gap: 7 (ref 7–16)
Calcium, Total: 9.1 mg/dL (ref 8.5–10.1)
Chloride: 110 mmol/L — ABNORMAL HIGH (ref 98–107)
Co2: 29 mmol/L (ref 21–32)
Creatinine: 2.07 mg/dL — ABNORMAL HIGH (ref 0.60–1.30)
Glucose: 158 mg/dL — ABNORMAL HIGH (ref 65–99)
Potassium: 4.6 mmol/L (ref 3.5–5.1)
Sodium: 146 mmol/L — ABNORMAL HIGH (ref 136–145)

## 2013-01-07 LAB — BASIC METABOLIC PANEL
Anion Gap: 7 (ref 7–16)
BUN: 57 mg/dL — ABNORMAL HIGH (ref 7–18)
Chloride: 106 mmol/L (ref 98–107)
Creatinine: 1.75 mg/dL — ABNORMAL HIGH (ref 0.60–1.30)
EGFR (African American): 47 — ABNORMAL LOW
EGFR (Non-African Amer.): 41 — ABNORMAL LOW
Glucose: 154 mg/dL — ABNORMAL HIGH (ref 65–99)
Sodium: 143 mmol/L (ref 136–145)

## 2013-01-07 LAB — CLOSTRIDIUM DIFFICILE BY PCR

## 2013-01-08 LAB — BASIC METABOLIC PANEL
Anion Gap: 6 — ABNORMAL LOW (ref 7–16)
BUN: 51 mg/dL — ABNORMAL HIGH (ref 7–18)
Calcium, Total: 9.2 mg/dL (ref 8.5–10.1)
Co2: 31 mmol/L (ref 21–32)
Creatinine: 1.57 mg/dL — ABNORMAL HIGH (ref 0.60–1.30)
EGFR (African American): 54 — ABNORMAL LOW
EGFR (Non-African Amer.): 47 — ABNORMAL LOW
Glucose: 172 mg/dL — ABNORMAL HIGH (ref 65–99)
Osmolality: 301 (ref 275–301)

## 2013-01-09 LAB — BASIC METABOLIC PANEL
Anion Gap: 4 — ABNORMAL LOW (ref 7–16)
BUN: 48 mg/dL — ABNORMAL HIGH (ref 7–18)
Calcium, Total: 8.9 mg/dL (ref 8.5–10.1)
Chloride: 105 mmol/L (ref 98–107)
Co2: 33 mmol/L — ABNORMAL HIGH (ref 21–32)
Creatinine: 1.58 mg/dL — ABNORMAL HIGH (ref 0.60–1.30)
EGFR (African American): 54 — ABNORMAL LOW
EGFR (Non-African Amer.): 46 — ABNORMAL LOW
Glucose: 170 mg/dL — ABNORMAL HIGH (ref 65–99)
Osmolality: 300 (ref 275–301)
Potassium: 4.2 mmol/L (ref 3.5–5.1)
Sodium: 142 mmol/L (ref 136–145)

## 2013-01-10 LAB — PLATELET COUNT: Platelet: 199 10*3/uL (ref 150–440)

## 2013-01-10 LAB — HEMOGLOBIN: HGB: 10.9 g/dL — ABNORMAL LOW (ref 13.0–18.0)

## 2013-01-13 LAB — WOUND AEROBIC CULTURE

## 2013-01-14 LAB — PLATELET COUNT: Platelet: 263 10*3/uL (ref 150–440)

## 2013-02-04 ENCOUNTER — Ambulatory Visit: Payer: Self-pay | Admitting: Internal Medicine

## 2013-07-07 DEATH — deceased

## 2013-12-20 IMAGING — US US RENAL KIDNEY
1 series · 14 of 25 positions shown · non-contrast
Comparison: none

REASON FOR EXAM: acute renal failure.
COMMENTS:

[Series 1: us renal kidney · 0.40mm/px · 14 of 41 slices shown]
[im 1/41]
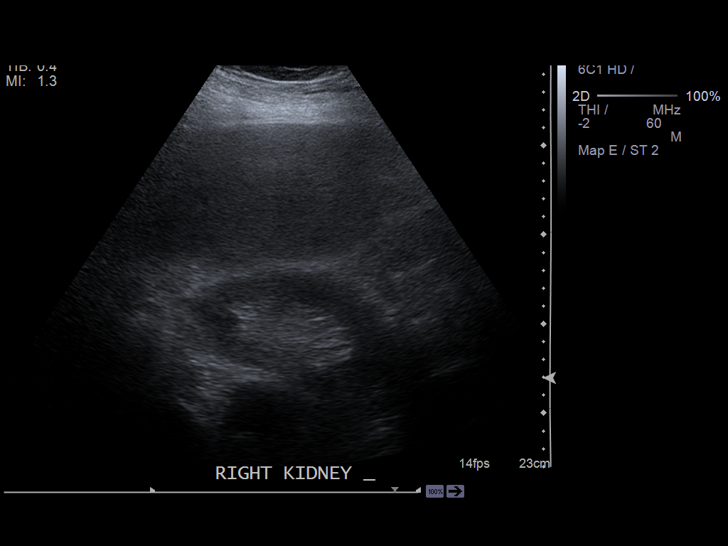
[im 4/41]
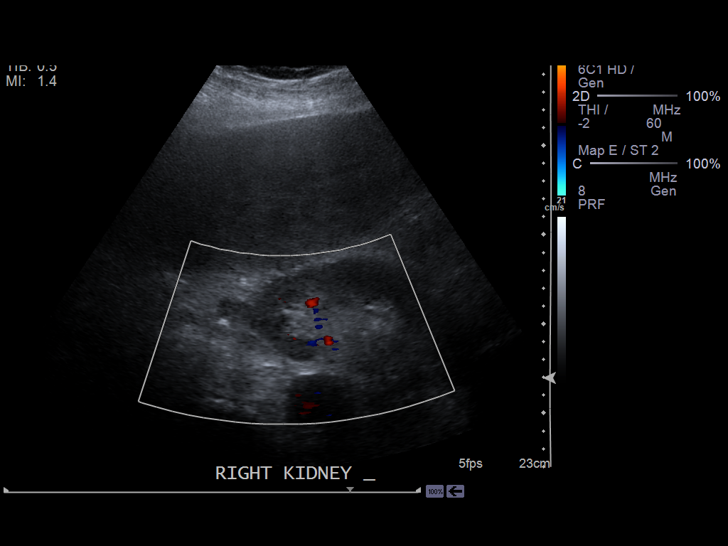
[im 7/41]
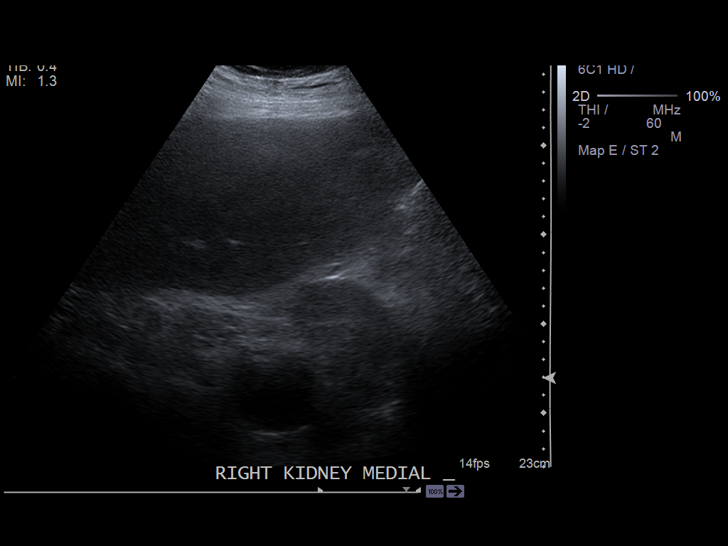
[im 11/41]
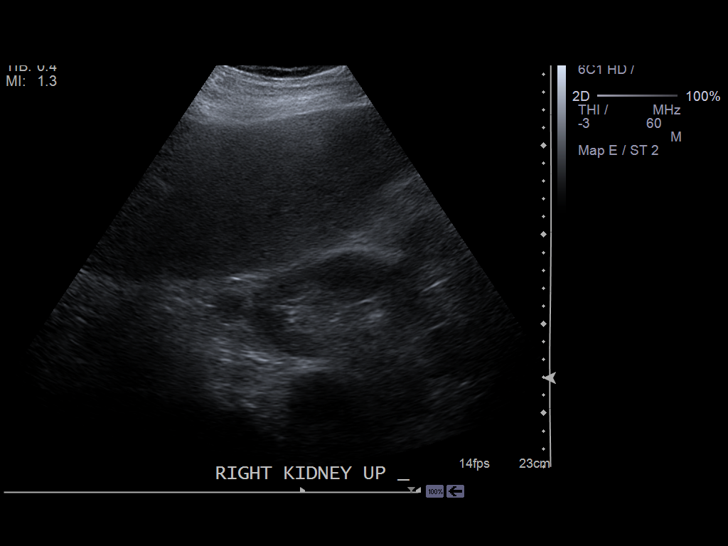
[im 14/41]
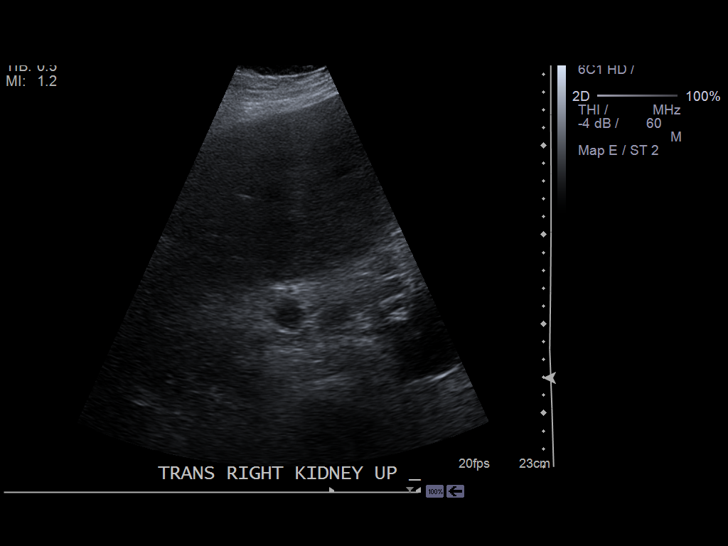
[im 16/41]
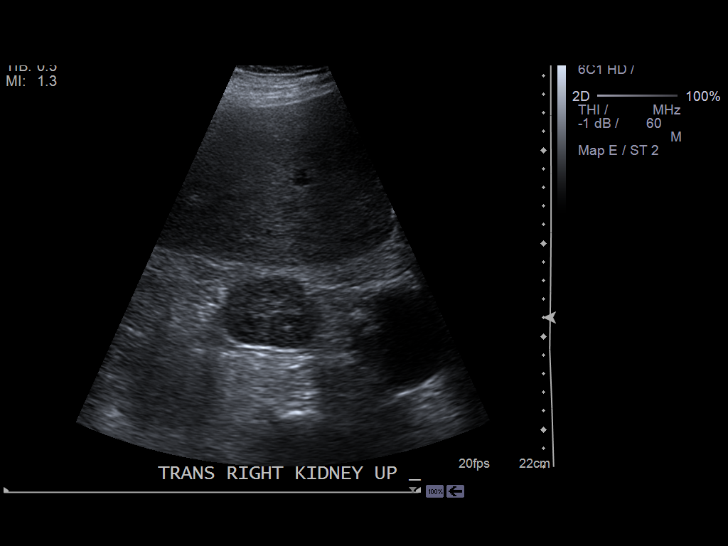
[im 19/41]
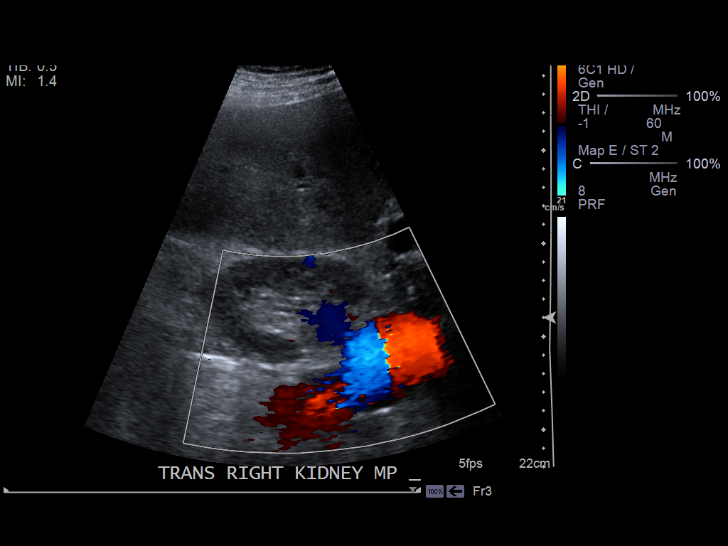
[im 22/41]
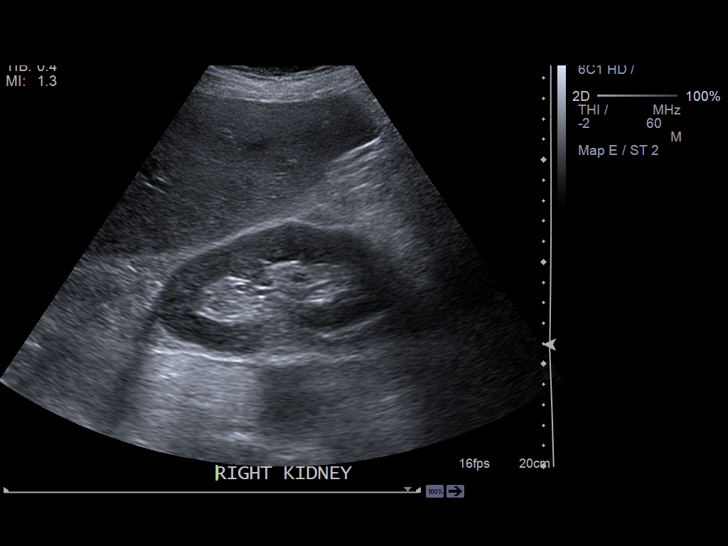
[im 26/41]
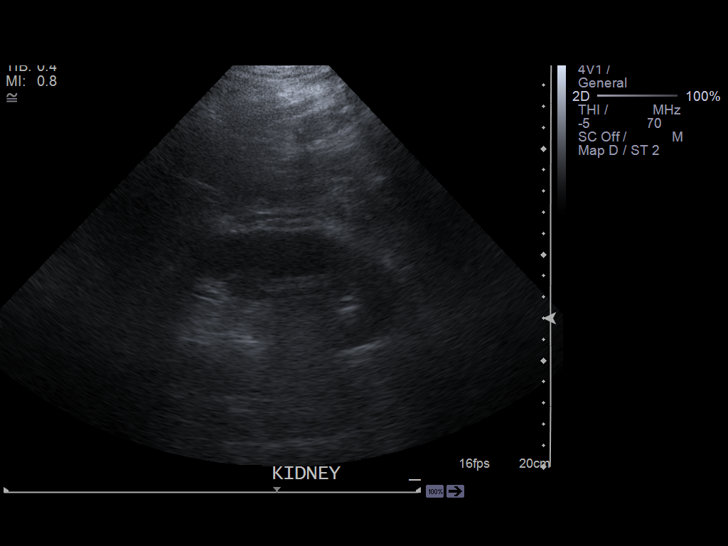
[im 27/41]
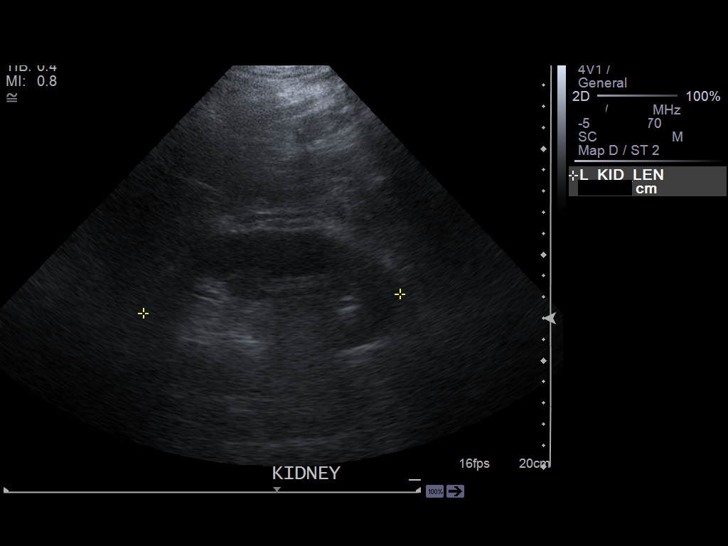
[im 31/41]
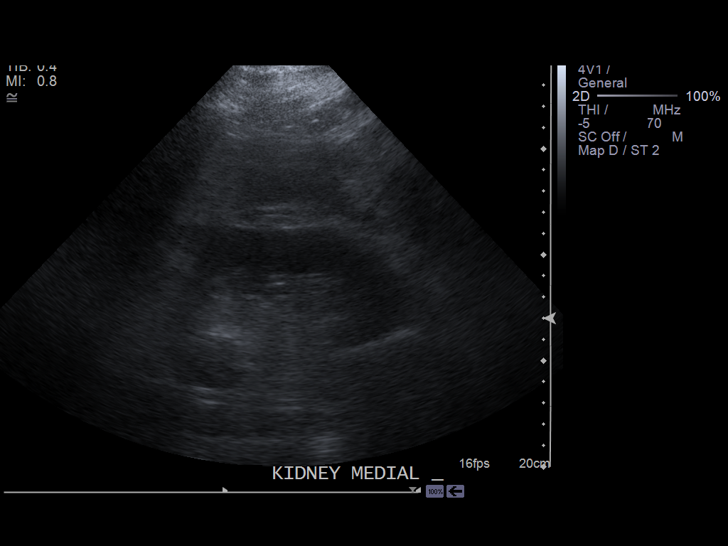
[im 34/41]
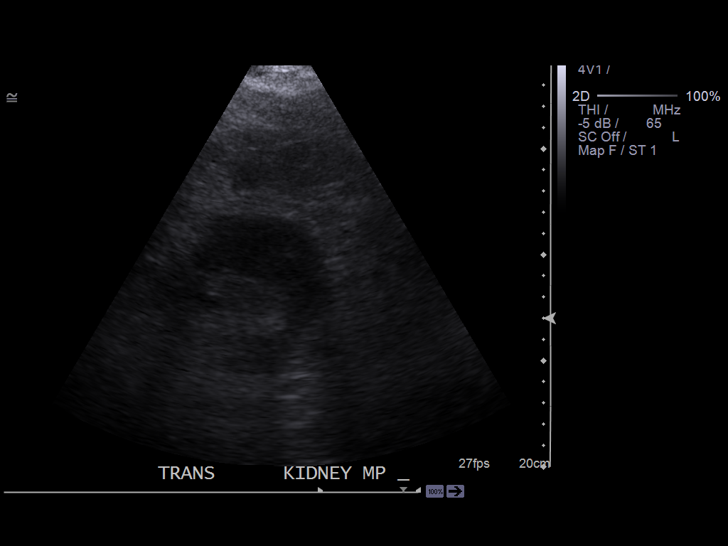
[im 37/41]
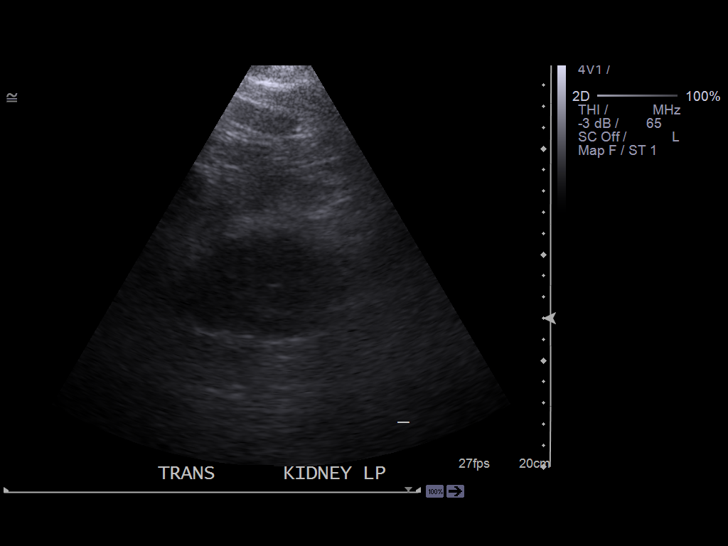
[im 41/41]
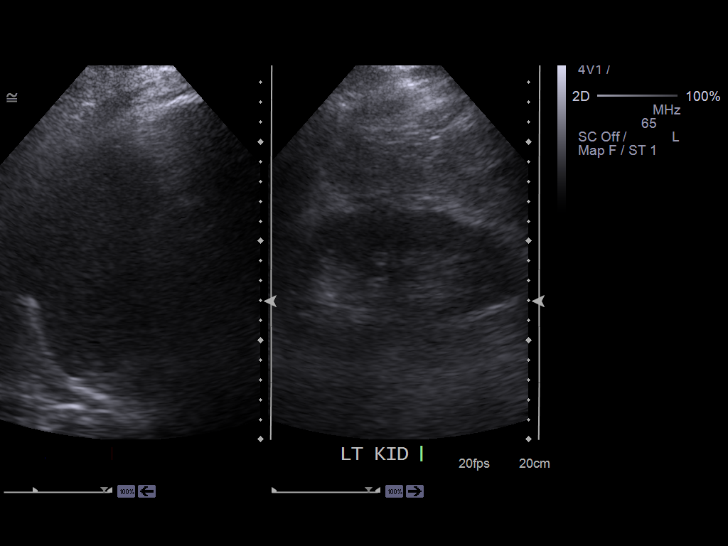

[14 of 25 positions shown; findings below may reference images not displayed]

PROCEDURE:     US  - US KIDNEY  - December 27, 2012 [DATE]

RESULT:     Renal sonogram shows the right kidney measures 11.82 x 6.37 x
5.17 cm. The left kidney measures 12.15 x 6.0 x 6.70. There is a hypoechoic
exophytic mass from the upper pole the right kidney measuring 2.12 x 1.79 x
1.97 cm. It is difficult to determine if this is solid or cystic. No stones
are evident. The urinary bladder is decompressed by a catheter. The
exophytic nodular density along the upper pole the right kidney was
demonstrated on a previous abdominal ultrasound 29 June, 2011 and
appears to be essentially unchanged in size to possibly minimally enlarged.
IMPRESSION: Grossly stable hypoechoic lesion in the upper pole the
right kidney exophytic from the kidney without gross change in size. The
maximal measurement previously was 1.9 cm. On today's study the maximal
measurement is approximately 2.1 cm. No hydronephrosis or stone evident.

[REDACTED]

## 2014-01-02 IMAGING — CR DG CHEST 1V PORT
1 series · 1 of 1 positions shown · non-contrast
Comparison: none

REASON FOR EXAM: cough
COMMENTS:

PROCEDURE:     DXR - DXR PORTABLE CHEST SINGLE VIEW  - January 09, 2013  [DATE]
RESULT:     Comparison: 01/02/2013

[ap]
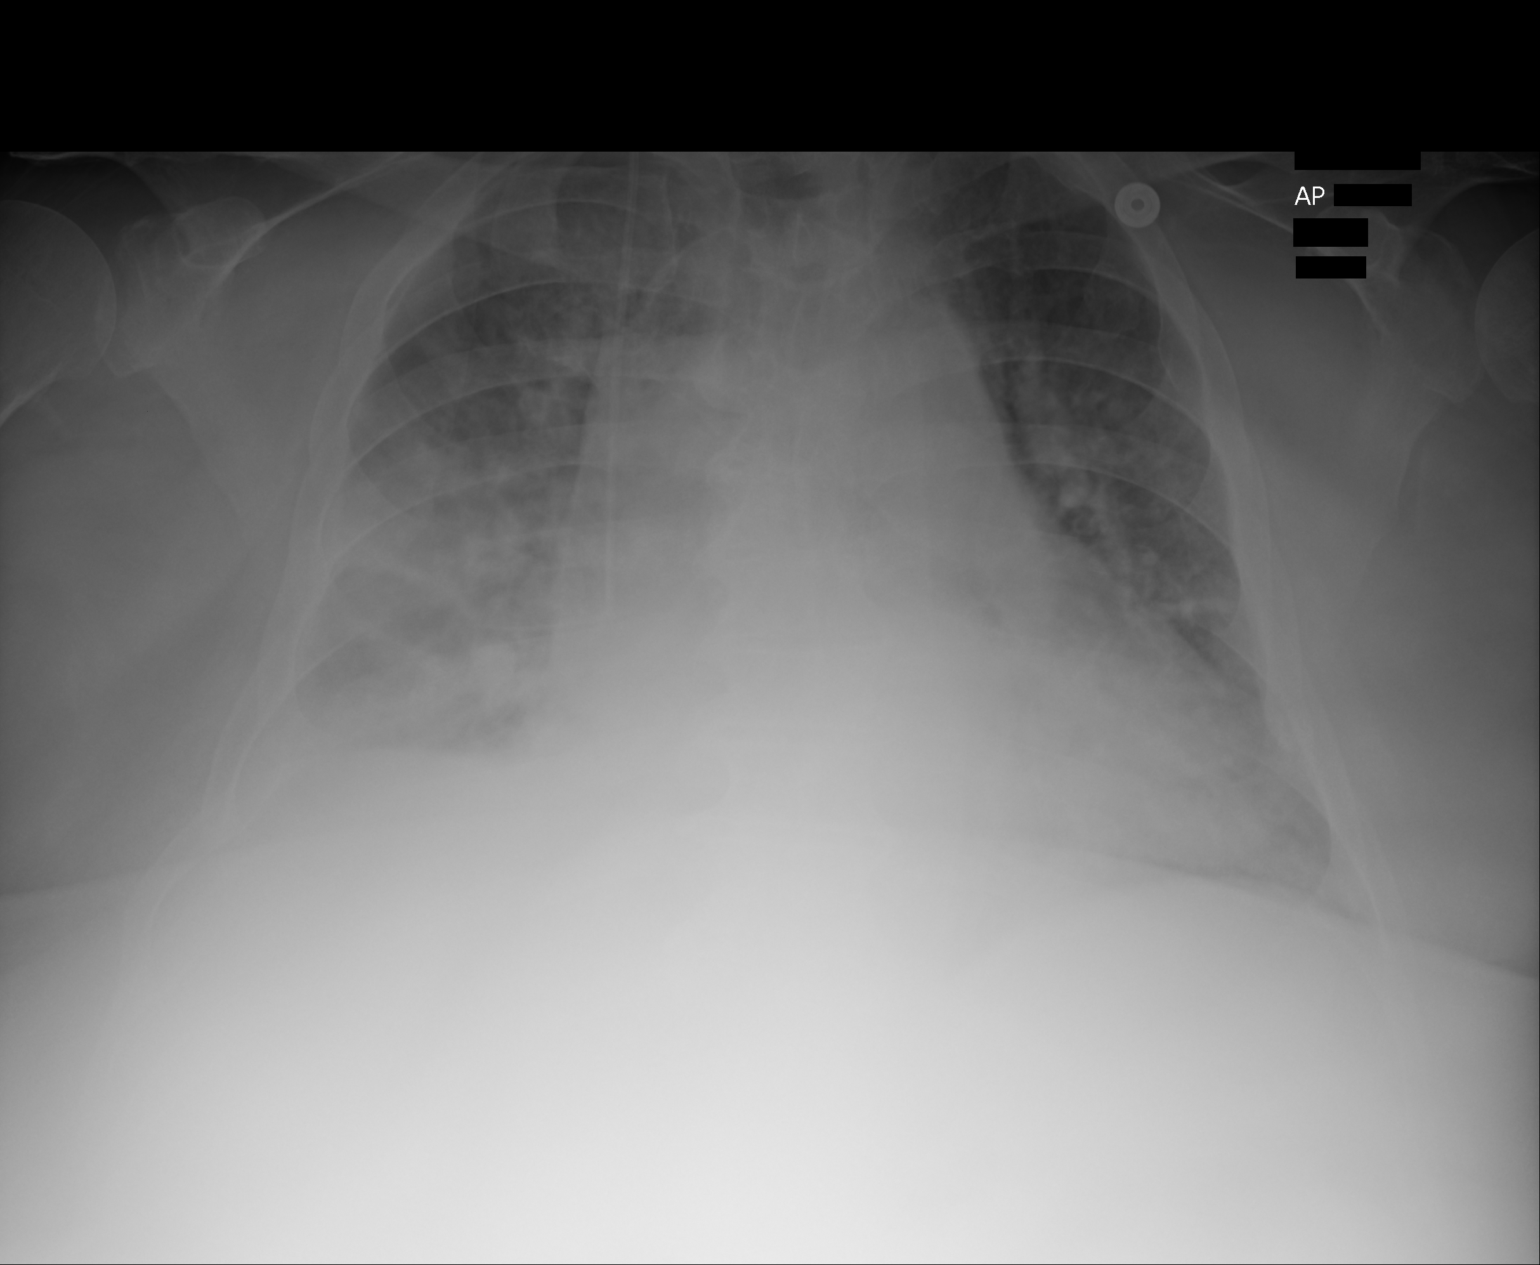

[1 of 1 positions shown; findings below may reference images not displayed]

FINDINGS: Evaluation is slightly limited by underpenetration secondary to patient body
habitus. Cardiomegaly and the mediastinum are similar to prior. Right IJ
central venous catheter overlies the right atrium. There are bilateral
heterogeneous opacities, which are greatest in the right lower lung. These
appear relatively similar to prior.
IMPRESSION: Bilateral heterogeneous opacities, right greater than left, are similar to
prior. Possibilities would include pulmonary edema and infection. Followup
PA and lateral chest radiograph is recommended to ensure resolution.

[REDACTED]

## 2014-10-24 NOTE — Discharge Summary (Signed)
PATIENT NAME:  Paul [name], Paul [name] MR#:  [id number] DATE OF BIRTH:  [birth year]  DATE OF ADMISSION:  01/20/2012 DATE OF DISCHARGE:  01/23/2012  PRESENTING COMPLAINT: Lower extremity swelling and erythema.   DISCHARGE DIAGNOSES:  1. Chronic lymphedema with cellulitis, mild, right lower extremity.  2. Chronic obstructive pulmonary disease.  3. Morbid obesity.  4. Type 2 diabetes.  5. Gastroesophageal reflux disease.   CONDITION ON DISCHARGE: Fair.   MEDICATIONS:  1. Singulair 10 mg p.o. daily.  2. Hydrochlorothiazide/lisinopril 12.5/20, one tablet b.i.[name].  3. Lantus 50 units at bedtime.  4. K-Dur 10 milliequivalents p.o. daily.  5. Metformin 1000 mg b.i.[name].  6. Cardizem CD 300 mg extended release p.o. daily.  7. DuoNebs 3 mL inhaled 4 times daily.  8. Glipizide 10 mg 1 tablet b.i.[name].  9. Nexium 40 mg daily.  10. Lasix 40 mg daily as needed for fluid.  11. Spironolactone 25 mg 1/2 tablet daily.  12. Xopenex 2 puffs every four hours.  13. Ferrous sulfate 325, one tablet b.i.[name].  14. Acetaminophen/hydrocodone 500/5, one tablet every eight hours p.r.n.  15. DOK 100 mg capsule one b.i.[name].  16. Gentamicin ointment one application topically b.i.[name].  17. Bactrim DS 800/160, two tablets b.i.[name]. for six days.   FOLLOWUP: Follow-up with [facility] for dressing changes. Follow with lymphedema clinic as before.   LABORATORY, RADIOLOGICAL AND DIAGNOSTIC DATA: White count 7.8, hemoglobin and hematocrit of 9.6 and 30.6, platelet count is 94. Glucose 94, BUN 34, creatinine 1.26, sodium 135. The rest of the chemistries within normal limits. Blood cultures no growth in 36 hours. EKG shows atrial fibrillation. LFTs within normal limits. Wound aerobic, anaerobic culture of the right extremity shows heavy growth of Klebsiella, Pseudomonas and gram-positive cocci. Infectious disease consultation with Dr. Leavy CellaBlocker.   BRIEF SUMMARY OF HOSPITAL COURSE: Paul [name] is a 64 year old Caucasian gentleman well known to our  service from previous many admissions, has multiple medical problems admitted with:  1. Right lower extremity cellulitis. Culture grew polymicrobial in nature. Wound culture done at his primary care doctor's office grew Pseudomonas and beta-hemolytic strep. The patient was started initially on broad-spectrum antibiotics with. IV Zosyn and infectious disease consultation was obtained. Given his severe lymphedema and skin breakdown, this did not seem to be true cellulitis, however, since he was already started on antibiotics. Antibiotics were changed to p.o. Bactrim for 7 days by Dr. Leavy CellaBlocker. Wound team consult was done to evaluate his chronic lymphedema. The patient will work with lymphedema clinic in the rehab center at [facility]. [facility] also [facility] since there is nobody to help the patient to get dressing changes done. He is unable to afford home health.  2. Acute renal failure. Appeared prerenal azotemia, resolved. His home meds were resumed.  3. Type 2 diabetes, on glipizide and Lantus.  4. Gastroesophageal reflux disease, on Nexium.  5. Chronic anemia, on iron.  6. Chronic obstructive pulmonary disease. Continue Singulair, DuoNebs, inhalers and nebulizers.  Hospital stay otherwise remained stable. The patient remained a FULL CODE.   TIME SPENT: 40 minutes.  ____________________________ Wylie HailSona A. Allena KatzPatel, MD sap:ap [name]: 01/23/2012 14:00:32 ET             T: 01/24/2012 13:43:39 ET                   JOB#: [id number] cc: Elyanna Wallick A. Allena KatzPatel, MD, <Dictator> Leanna SatoLinda M. Miles, MD Rosalyn GessMichael E. Blocker, MD Willow OraSONA A Katlyn Muldrew MD ELECTRONICALLY SIGNED 02/02/2012  13:14 

## 2014-10-24 NOTE — H&P (Signed)
PATIENT NAME:  Paul [name], Brewer D MR#:  [id number] DATE OF BIRTH:  [birth year]  DATE OF ADMISSION:  01/20/2012  PRIMARY CARE PHYSICIAN: Dr. Darreld McleanLinda Miles.   CHIEF COMPLAINT: Right lower extremity redness and swelling.   HISTORY OF PRESENT ILLNESS: The patient is a 64 year old male who presents from his primary care physician's office due to right lower extremity redness and swelling. The patient said that he noticed a sore on his heel of his right foot a few days ago, and then the swelling and redness got worse, and he has had significant pain in the past 2 to 3 days. He went to see his primary care physician. They did a wound culture on his leg a few days back. The wound cultures grew out polymicrobial consistent with staph aureus, pseudomonas and beta hemolytic strep.  The patient was discharged thus on doxycycline from the office, but since the wound cultures are growing polymicrobial he was advised to come to the ER for further evaluation. The patient denies any fever. He denies any nausea or vomiting, abdominal pain, any chest pain, shortness of breath, or any other associated symptoms presently.   REVIEW OF SYSTEMS: CONSTITUTIONAL: No documented fever. No weight gain, no weight loss. EYES: No blurry or double vision. ENT: No tinnitus or postnasal drip. No redness of the oropharynx. RESPIRATORY: No cough, no wheeze, no hemoptysis, and no dyspnea. CARDIOVASCULAR: No chest pain, no orthopnea, no palpitations, no syncope. GI: No nausea, no vomiting, no diarrhea, no abdominal pain, no melena or hematochezia. GU: No dysuria or hematuria. ENDOCRINE: No polyuria or nocturia. No heat or cold intolerance. HEMATOLOGIC:  No anemia, no bruising, no bleeding. INTEGUMENTARY: Positive cellulitic rash on the right lower extremity. No lesions. MUSCULOSKELETAL: No arthritis, no swelling, no gout. NEUROLOGIC: No numbness, no tingling, no ataxia, no seizure-type activity. PSYCHIATRIC: No anxiety, no insomnia, no ADD.   PAST  MEDICAL HISTORY:  1. Diabetes. 2. Hypertension. 3. Hyperlipidemia. 4. Morbid obesity. 5. Chronic obstructive pulmonary disease. 6. Chronic lymphedema.  7. History of a GI bleed. 8. Chronic anemia.   ALLERGIES: Actos, codeine, Januvia, Levaquin and Lipitor.   SOCIAL HISTORY: No smoking. No alcohol abuse. No illicit drug abuse. He lives at home by himself.   FAMILY HISTORY: He is adopted. He does not know about his family history.    CURRENT MEDICATIONS:  1. Tylenol with hydrocodone 5/500, 1 tab every 6 to 8 hours as needed.  2. Cardizem CD 300 mg daily.  3. Doxycycline 100 mg b.i.d.  4. DuoNebs.  5. Iron sulfate 325 mg b.i.d.  6. Glipizide 10 mg b.i.d.  7. Hydrochlorothiazide/lisinopril 12.5/20, 1 tab b.i.d.  8. Lantus 50 units at bedtime.  9. Lasix 40 mg daily as needed.  10. Metformin 1000 mg b.i.d.  11. Nexium 40 mg daily.  12. Potassium 20 mEq daily.  13. Singulair 10 mg daily.  14. Aldactone 25 mg, 1/2 tab daily.  15. Xopenex inhaler 2 puffs every 4 hours as needed.   PHYSICAL EXAMINATION ON ADMISSION:  VITAL SIGNS: Temperature 97.8, pulse 80, respirations 20, blood pressure 145/62, saturations 95% on room air.   GENERAL: He is a pleasant-appearing male in no apparent distress.   HEENT: Atraumatic, normocephalic. Extraocular muscles are intact. Pupils are equal and reactive to light. Sclerae are anicteric. No conjunctival injection. No oropharyngeal erythema.   NECK: Supple. There is no jugular venous distention, no bruits, no lymphadenopathy, no thyromegaly.   HEART: Regular rate and rhythm. No murmurs, no rubs, no clicks.  LUNGS: Clear to auscultation bilaterally. No rales, no rhonchi, no wheezes.   ABDOMEN: Soft, flat, nontender, nondistended. Has good bowel sounds. No hepatosplenomegaly appreciated.   EXTREMITIES: No evidence of any cyanosis or clubbing. He does have significant +2 to 3 pitting edema bilaterally on the right leg. He has got significant redness  and swelling in the right medial aspect of his leg with some yellow crusting but no acute drainage. He also has an open sore on his right heel which is not draining anything. It is slightly tender to touch and red.   SKIN: Moist and warm. He does have a significant right lower extremity cellulitic rash. His legs are significantly swollen due to a significant lymphedema bilaterally.   NEUROLOGICAL: He is alert, awake, and oriented x3 with no focal motor or sensory deficits appreciated bilaterally.   LYMPHATIC: There is no cervical or axillary lymphadenopathy.   LABORATORY, DIAGNOSTIC AND RADIOLOGICAL DATA:  Serum glucose of 187, BUN 41, creatinine 1.4, sodium 132, potassium 4.8, chloride 94, bicarbonate 26.  The patient's liver function tests are within normal limits.  White cell count 7.9, hemoglobin 9.4, hematocrit 30.9, platelet count 284.  Prothrombin time is 14.4, INR 1.1.  Urinalysis is within normal limits.   ASSESSMENT AND PLAN: The patient is a 64 year old male with a history of diabetes, hypertension, gastroesophageal reflux disease, chronic lymphedema, chronic obstructive pulmonary disease, history of previous GI bleed, presents to the hospital with right lower extremity redness and swelling and suspected cellulitis. The patient's wound cultures from his primary care physician's office are noted to be polymicrobial in nature.   1. Right lower extremity cellulitis: This is polymicrobial in nature, as mentioned. The patient had a wound culture done at his primary care physician's office which grew out staph Pseudomonas and also beta hemolytic strep. The patient was given p.o. doxycycline, although it is not sensitive to doxycycline. He is, therefore, being admitted for IV antibiotics. I will put him on IV Zosyn for now. I will get a Wound team consult to evaluate as he has chronic lymphedema. I will also get an Infectious Disease consult. I discussed the case with Dr. Leavy Cella. I  follow him  clinically with treatment of antibiotics. The patient is currently afebrile and has a normal white cell count.  2. Acute renal failure: Likely prerenal in nature. I will go ahead and hold his Aldactone, ACE inhibitor and hydrochlorothiazide for now, gently hydrate him with IV fluids, and follow his BUN and creatinine and urine output.  3. Diabetes: Hold his metformin given his acute renal failure. Continue glipizide and Lantus for now.  4. Gastroesophageal reflux disease: Continue Nexium.  5. Hypertension: Continue with his Cardizem, hold HCTZ and ACE given the renal failure, give him some p.r.n. hydralazine.  6. Chronic anemia: Continue with his iron supplements.  7. Chronic obstructive pulmonary disease: I will continue with Singulair and p.r.n. DuoNebs.   CODE STATUS: The patient is a FULL CODE.   TIME SPENT: 50 minutes.   ____________________________ Rolly Pancake. Cherlynn Kaiser, MD vjs:cbb D: 01/20/2012 18:57:29 ET T: 01/21/2012 06:39:20 ET JOB#: [id number]  cc: Rolly Pancake. Cherlynn Kaiser, MD, <Dictator> Leanna Sato, MD Houston Siren MD ELECTRONICALLY SIGNED 01/21/2012 14:03

## 2014-10-24 NOTE — Consult Note (Signed)
PATIENT NAME:  Paul [name], Paul [name] MR#:  [id number] DATE OF BIRTH:  [birth year]  DATE OF CONSULTATION:  01/21/2012  REFERRING PHYSICIAN:  Hilda Lias, MD  CONSULTING PHYSICIAN:  Rosalyn Gess. Fredna Stricker, MD  REASON FOR CONSULTATION: Lymphedema with inflammation.   HISTORY OF PRESENT ILLNESS: The patient is a 64 year old white man with a past history significant for chronic lymphedema and diabetes who was admitted yesterday with several days worsening of swelling and redness of his legs. He has had chronic lymphedema with episodic inflammation and has been treated for possible cellulitis several times. He apparently had a culture obtained as an outpatient, which was a superficial swab, which grew multiple organisms. He was given doxycycline but continued to have pain, swelling, and redness and was admitted to the hospital yesterday. He denies any fevers, chills, or sweats. He has not had any nausea or vomiting. He has had an ulcer on his heel for the last several days. He denies any recent trauma. He was started on Zosyn.   ALLERGIES: Actos, codeine. Januvia, Levaquin, and Lipitor.   PAST MEDICAL HISTORY:  1. Diabetes.  2. Hypertension.  3. Hypercholesterolemia.  4. Chronic obstructive pulmonary disease.  5. Chronic lymphedema.  6. History of duodenal bleed.   SOCIAL HISTORY: The patient lives at home. He has a dog at home. He does not recall any bites or scratches by the dog. He does not smoke. He does not drink. No history of injecting drug use.   FAMILY HISTORY: The patient is adopted.  REVIEW OF SYSTEMS: GENERAL: No fevers, chills, or sweats. CHEST: No cough or shortness of breath. CARDIAC: No chest pains or palpitations. GI: No nausea, no vomiting, no abdominal pain, no change in his bowels. GU: No change in his urine. MUSCULOSKELETAL: He has had swelling in his lower extremities which is chronic but it has been worse recently. SKIN: He has had increased redness as well as ulcerations of the skin.  He is having serous drainage weeping from the skin as well. NEUROLOGIC: No focal weakness.   PHYSICAL EXAMINATION:   VITAL SIGNS: T-max 97.9, pulse 67, blood pressure 114/63, 95% on room air.   GENERAL: 64 year old obese white man in no acute distress.   HEENT: Normocephalic, atraumatic.   CHEST: Clear to auscultation bilaterally with good air movement. No focal consolidation.   CARDIAC: Regular rate and rhythm without murmur, rub, or gallop.   ABDOMEN: Obese, soft, nontender, and nondistended. No hepatosplenomegaly. No hernia is noted.   EXTREMITIES: No evidence for tenosynovitis. He does have lymphedematous changes bilaterally. There was erythema more prominent on the right than the left. There were ulcerations that were wrapped in bandages and were not directly observed on the right foot and heel. There was no evidence for lymphangitic streaking.   NEUROLOGIC: The patient was awake and interactive, moving all four extremities.   PSYCHIATRIC: Mood and affect appeared normal.   LABORATORY DATA: BUN 34, creatinine 1.26, bicarbonate 98, anion gap 28. LFTs were unremarkable on admission. White count today was 7.8 with hemoglobin 9.6, platelet count 294, ANC of 5.4. White count on admission was 7.9. Blood cultures on admission were negative. A wound culture is pending. Urinalysis was unremarkable.   IMPRESSION: This is a 64 year old white man with a history of chronic lymphedema admitted with possible cellulitis.   RECOMMENDATIONS:  1. It is difficult to determine if a patient with chronic lymphedema has infection as they often will have inflammation related to the underlying disease. He does have more erythema and  tenderness than usual but no systemic symptoms. His white count has been normal.  2. The presence of organisms on a superficial swab is difficult to interpret as these patients will often be chronically colonized at baseline.  3. Given his somewhat increased symptoms, will give a  7 to 10 day course of treatment.   4. Will continue Zosyn for now. Plan on changing him to oral therapy in the morning.  5. He will likely benefit from aggressive lymphedema therapy as an outpatient.   This is a low level Infectious Disease consult. Thank you very much for involving me in Mr. Duzan's care.   ____________________________ Rosalyn GessMichael E. Lysle Yero, MD meb:drc D: 01/21/2012 16:40:41 ET T: 01/21/2012 17:25:40 ET JOB#: [id number]  cc: Rosalyn GessMichael E. Chosen Garron, MD, <Dictator> Rayfield Beem E Delando Satter MD ELECTRONICALLY SIGNED 01/22/2012 12:48

## 2014-10-24 NOTE — Consult Note (Signed)
Impression: 64yo WM w/ h/o chronic lymphedema admitted with possible cellulitis.  It is difficult to determine when patient's with lymphedema have infection as they often will have inflammation related to the underlying disease.  He has more erythema and tenderness than usual, but no systemic symptoms.  His WBC has been normal. The presence of organisms on a superficial swab is difficult to interpret as these patients will often be chronically colonized at baseline. Given his somewhat increased symptoms, would give a 7-10 day course of treatment.   Will continue zosyn for now.  Will plan on changing to oral therapy in am. He would likely benefit from aggressive lymphedema therapy as an outpt.  Electronic Signatures: Sallyann Kinnaird, Rosalyn GessMichael E (MD)  (Signed on 17-Jul-13 16:34)  Authored  Last Updated: 17-Jul-13 16:34 by Milen Lengacher, Rosalyn GessMichael E (MD)

## 2014-10-27 NOTE — Consult Note (Signed)
PATIENT NAME:  Paul [name], Paul [name] MR#:  [id number] DATE OF BIRTH:  July 15, 1950  DATE OF CONSULTATION:  12/24/2012  REFERRING PHYSICIAN:  Katharina Caper, MD CONSULTING PHYSICIAN:  Rosalyn Gess. Ylianna Almanzar, MD  REASON FOR CONSULTATION: Possible infection.   HISTORY OF PRESENT ILLNESS: The patient is a 64 year old male with a past history significant for diabetes, chronic lymphedema and COPD who was admitted yesterday with chills and progressive weakness. The patient states that he developed chills for the last several days, having getting worse more recently, and his son brought him to the hospital. He had been treated by his primary care doctor for infection in his chronic lymphedematous legs and had been receiving Cipro and Augmentin. The patient states that his legs are always red and swollen and he has intermittent drainage. This was unchanged. He had also been having some worsening renal function as an outpatient. On admission, the patient was started on linezolid. He was transferred to the CCU today because of significant respiratory acidosis. His ammonia level has also been noted to be very high. He has been started on CPAP, but getting a history was somewhat difficult. His antibiotics were changed to Zosyn, linezolid and azithromycin.   ALLERGIES: ACTOS, CODEINE, JANUVIA, LEVAQUIN AND LIPITOR.   PAST MEDICAL HISTORY: 1.  Obesity.  3.  Chronic lymphedema.  4.  COPD. 5.  Diabetes.  6.  Hypertension.  7.  Hypercholesterolemia.  8.  Atrial fibrillation.  He was previously on anticoagulation, but this was stopped due to a GI bleed. 9.  Chronic anemia.   SOCIAL HISTORY: The patient lives at home. He was ambulating with a cane. He does not smoke. He does not drink. No injecting drug use history.   FAMILY HISTORY: The patient is adopted.   REVIEW OF SYSTEMS:   CONSTITUTIONAL: Somewhat limited because of his current respiratory distress, but he denies any fevers. He was having some shaking chills. No  sweats. Positive generalized weakness and malaise.  HEENT: No headaches.  No sinus congestion. No sore throat.  NECK: No stiffness. No swollen glands.  RESPIRATORY: No significant cough or shortness of breath at home. No sputum production, although he has had some sputum production since he has been in the hospital.  CARDIAC: No chest pains or palpitations. Positive chronic peripheral edema.  GASTROINTESTINAL: No nausea, no vomiting, no abdominal pain and no change in his bowels.  GENITOURINARY: No change in his urine.  MUSCULOSKELETAL: He has had generalized weakness, but no focal skin or joint complaints.  SKIN: No rashes other than his chronic lymphedematous changes in his legs.  NEUROLOGIC: No focal weakness. He did have some confusion, per the H and P.  PSYCHIATRIC: No complaints. All other systems are negative.   PHYSICAL EXAMINATION: VITAL SIGNS: T-max of 97.0, T-current 96.5, pulse 66, blood pressure 124/72 and 98% on CPAP.  GENERAL: An obese 64 year old white man, somewhat confused, in mild respiratory distress.  HEENT: Normocephalic, atraumatic. Pupils equal and reactive to light. Extraocular motion intact. Sclerae, conjunctivae and lids are without evidence for emboli or petechiae. Oropharynx was unable to be examined as the patient was on CPAP.  NECK: Midline trachea. No lymphadenopathy. No thyromegaly.  LUNGS: Clear to auscultation bilaterally.  HEART:  Regular rate and rhythm without murmur, rub or gallop.  ABDOMEN: Soft, nontender and nondistended. No hepatosplenomegaly. No hernia is noted. Positive obesity.  EXTREMITIES: No evidence for tenosynovitis. Both lower extremities from below the knee down had chronic changes consistent with lymphedema. This area was red with  bulbous skin changes. There were some crevices, but no obvious purulent drainage was noted. No other joints or muscles were demonstrating tenosynovitis.  SKIN: No rashes other than the lower extremities, as  described above. No stigmata of endocarditis, specifically no Janeway lesions or Osler nodes.  NEUROLOGIC: The patient was somewhat lethargic.  He had some difficulty providing history both due to his lethargy and the CPAP, but appeared to be able to understand the current situation.  PSYCHIATRIC: Mood and affect appeared normal.   LABORATORY AND DIAGNOSTICS: BUN of 85, creatinine 4.12, bicarbonate 24 and anion gap of 6. His bicarbonate on admission was 22.  His ammonia level was 122. It was 104 on admission. LFTs were unremarkable. CPK was 54 on admission and 54 today. White count is 5.4 with a hemoglobin of 11.2, platelet count 148 and ANC of 3.6. White count on admission was 6.7. Blood cultures show no growth to date. A urinalysis from admission had negative nitrites, 3+ leukocyte esterase, 4 red cells and 43 white cells per high-power field. Urine culture shows no growth to date.   ABG from this morning was 7.13/66/120/97.4 on nasal cannula with unknown amount of oxygen. Lactic acid level at that time was 0.7. Repeat ABG 6 hours later was 7.17/60/97/96.6 on presumed 4 liters nasal cannula.  Chest x-ray from admission shows congestive heart failure with some consolidation in the right mid lung that could represent pneumonia versus chronic atelectasis. A repeat chest x-ray today showed stable cardiomegaly with right lung base pneumonia versus atelectasis and diffuse interstitial edema.   IMPRESSION: A 64 year old male with a history of lymphedema and chronic obstructive pulmonary disease admitted with acute renal insufficiency, respiratory acidosis with hypercarbic respiratory failure and elevated ammonia levels.   RECOMMENDATIONS: 1.  I suspect that his high ammonia levels are making him more lethargic and causing hypoventilation. He is currently on CPAP in the unit and we will see if this can correct his acidosis. If not, he will need intubation. Pulmonary is to see him.  2.  Unclear cause for his  elevated ammonia level. Given his renal failure, question of toxic ingestion. He was not found unresponsive, which could cause rhabdomyolysis (and subsequent renal failure). His CPK was normal, however.  3.  I am not sure that there is an active infection. He reports some chills but has no fever and a normal white count. His chronic lymphedema is unchanged per his report. He has chronic drainage. I do not see any evidence for active infection in the legs.  4.  Given his chills and worsening respiratory status would continue broad coverage. If blood cultures are negative, then would stop his antibiotics.  5.  We will try to get a sputum culture.  6.  He is to go for an echo.  7.  Would consider evaluation for cirrhosis given his ammonia levels.  8. Will send serology for hepatitis B and hepatitis C.   This is a high-level infectious disease case. Thank you very much for involving me in Mr. Ketterman's care.  ____________________________ Rosalyn GessMichael E. Damyan Corne, MD meb:sb D: 12/24/2012 13:42:34 ET T: 12/24/2012 14:14:43 ET JOB#: [id number]  cc: Rosalyn GessMichael E. Carloyn Lahue, MD, <Dictator> Ladonne Sharples E Nevan Creighton MD ELECTRONICALLY SIGNED 12/27/2012 11:39

## 2014-10-27 NOTE — Discharge Summary (Signed)
PATIENT NAME:  Paul [name], Paul [name] MR#:  [id number] DATE OF BIRTH:  May 04, 1951  DATE OF ADMISSION:  12/23/2012 DATE OF DISCHARGE:    Discharged to skilled nursing facility.   DISCHARGE DIAGNOSES:  Please refer to previously dictated interim discharge summaries.  1.  Acute on chronic respiratory failure secondary sleep apnea and Pickwickian syndrome.  2.  Elevated troponin, likely from demand ischemia.  3.  Methicillin-resistant Staphylococcus aureus lower extremity ulcer infection.  4.  Acute renal failure over chronic kidney disease stage III. 5.  Sepsis.  6.  Acute on chronic diastolic congestive heart failure.  7.  Metabolic encephalopathy.  8.  Chronic lymphedema.  9.  Hyperkalemia.  10.  Morbid obesity.  11.  General debility.  12.  Chronic obstructive pulmonary disease.  13.  Sleep apnea.   Please refer to interim discharge summary dictated on 01/11/2013. Since 01/11/2013, the patient has done well. He has continued his wound care. Feeding has been ongoing with aspiration precautions. The patient worked with Physical Therapy and has been extremely weak and barely able to stand up. He was evaluated to return to his assisted living facility, but a patient with his extreme weakness and unable to get out of bed with his increased need for care will be going to skilled nursing facility.   Today, the patient does continues to have his chronic lymphedema on exam. Does not have any signs of cellulitis, is on Zyvox for his MRSA infection, which will be continued for 4 more days. He will be on 2 L nasal cannula.   DISCHARGE MEDICATIONS:  Include 1.  DuoNeb 3 mL inhaled 4 times a day as needed for shortness of breath.  2.  Docusate sodium 100 mg oral once a day as needed for constipation.  3.  Benzonatate 100 mg oral 3 times a day as needed for cough.  4.  Metoprolol 12.5 mg oral 2 times a day. 5.  Xanax 0.25 mg oral every 8 hours as needed.  6.  Acetaminophen hydrocodone 325/5, 1 tablet oral  every 6 hours as needed for pain.  7.  Lyrica 75 mg oral once a day.  8.  Loperamide 2 mg oral 4 times a day as needed for diarrhea.  9.  Lasix 40 mg oral once a day.  10.  Nystatin 100,000 units topical powder topically 2 times a day.  11.  Silver sulfadiazine 1% topical cream application 2 times a day.  12.  Ferrous sulfate 325 mg oral 2 times a day with meals.  13.  Zyvox 600 mg oral every 12 hours for 4 days.  14.  Protonix 40 mg oral once a day. 15.  Advair Diskus 250/50, 1 puff inhaled 2 times a day.  16.  Ocular lubricant 2 drop to each affected eye every 6 hours as needed for dry eyes.  17.  Oxybutynin 5 mg oral 2 times a day as needed.  18.  Glucerna shake 237 mL oral once a day. 19.  Glipizide 2.5 mg oral once a day.  20.  Spiriva 18 mcg inhaled once a day.   DISCHARGE INSTRUCTIONS:  The patient will be on a low-sodium, carbohydrate-controlled diet. Food will be regular consistency with thin liquids, aspiration precautions, positioning upright and had head forward for all p.o.s. Activity will be as tolerated. Follow up with the rehab physician in 1 to 2 days. Continue wound care as per Wound nurse instructions.   TIME SPENT:  Time spent on day of discharge in discharge  activity was 48 minutes.  ____________________________ Molinda Bailiff Natasia Sanko, MD srs:jm [name]: 01/14/2013 14:24:52 ET T: 01/14/2013 14:53:47 ET JOB#: [id number]  cc: Wardell Heath R. Kamaron Deskins, MD, <Dictator> Orie Fisherman MD ELECTRONICALLY SIGNED 01/23/2013 23:39

## 2014-10-27 NOTE — H&P (Signed)
PATIENT NAME:  Paul [name], Paul [name] MR#:  [id number] DATE OF BIRTH:  [birth year]  DATE OF ADMISSION:  12/23/2012  PRIMARY CARE PHYSICIAN: Dr. Darreld Mclean.  REFERRING EMERGENCY ROOM PHYSICIAN: Dr. Malachy Moan .   CHIEF COMPLAINT: Tremors and weakness.   HISTORY OF PRESENT ILLNESS: This is a 64 year old male with a past medical history of diabetes, hypertension, hyperlipidemia, morbid obesity, chronic lymphedema, chronic obstructive pulmonary disease and chronic infection on left foot who is living at home alone at his baseline until the last one month. He was able to walk with a cane and gradually getting worse and few weeks ago in visit with Dr. Darreld Mclean, she noticed that his renal function is getting worse and also noticed last week that he has a wound on the left foot which he it now looks infected and so was started on antibiotics last week ciprofloxacin and Augmentin. In last week, the patient got progressively more weak.  He started having tremors and he is weak to the level that he is not able to walk now and so even not able to hold of water and lethargic.  He slightly started having some memory problem also as per the family member present in the room. Actually he was scheduled to have nephrology appointment in the clinic by Dr. Darreld Mclean when she noticed one week ago having renal function worsening, but when the family called with this type of findings to her today, they told them to directly bring him to the Emergency Room instead of waiting for the appointment. On arrival to ER, the patient was found in acute renal failure, elevated creatinine at 2.85 and potassium level at 5.8.  He is being admitted for further management of this.  REVIEW OF SYSTEMS  Negative for fever. Positive for fatigue and weakness. No pain or weight loss or weight gain.   EYES: No blurring, double vision or redness.   EARS, NOSE, THROAT: No tinnitus, hearing loss or discharge.   RESPIRATORY: No cough or wheezing.   Has shortness of breath, which is chronic and he is using oxygen at home.    CARDIOVASCULAR: No chest pain, orthopnea, edema or arrhythmia.  GASTROINTESTINAL: No nausea, vomiting, diarrhea or abdominal pain.   GENITOURINARY: No dysuria, hematuria or increased or decreased frequency.   ENDOCRINOLOGY: No heat or cold intolerance.  No nocturia.   SKIN: No acne, rashes, but had severe lymphedema both legs and has a wound which is infected on left foot.   MUSCULOSKELETAL: Not any pain in the joints but overall weakness and morbidly obese and lymphedema on the legs.   NEUROLOGICAL: No numbness, weakness, but has overall generalized weakness.   PSYCHIATRIC: No anxiety, insomnia.   PAST MEDICAL HISTORY:  1. Diabetes.  2. Hypertension.  3. Hyperlipidemia.  4. Morbid obesity.  5. Chronic obstructive pulmonary disease.  6. Chronic lymphedema.  7. History of atrial fibrillation.  8. History of GI bleed secondary to anticoagulation starting for atrial fibrillation and that is why it was stopped. 9. Chronic anemia.   SOCIAL HISTORY: No smoking. No alcohol, no illicit drug use. Lives alone at home. Was able to walk with a cane before 1 to 2 weeks.   FAMILY HISTORY: He is adopted and so we do not know about his family history.  HOME MEDICATION: 1.  Amoxicillin clavulanate 875/125 mg 2 times a day for 10 days. 2. Benzonatate 100 mg oral capsule 3 times a day.  3. Ciprofloxacin 500 mg 2 times a day for  10 days.  4. Docusate sodium 100 mg once a day as needed for constipation. 5. DuoNeb 3 mL inhalation 4 times a day as needed for shortness of breath.  6. Ferrous sulfate 325 mg once a day.  7. Furosemide 40 mg oral tablet once a day as needed for swelling.  8. Glipizide 10 mg oral tablet 2 times a day for diabetes.  9. Hydrochlorothiazide and lisinopril 12.5 + 20 mg oral tablet 2 times a day. 10. Lantus 27 units once a day as needed for sliding scale. 11. Lyrica 75 mg oral capsule; he is on  1 capsule once a day right now and then will slowly go off. 12. Omeprazole 20 mg delayed-release capsule once a day. 13. Xopenex 2 puffs inhaled 4 times a day as needed for shortness of breath.   PHYSICAL EXAMINATION: VITAL SIGNS:  In the ER, temperature 98.6, pulse rate 75, respirations 20, blood pressure 95/52.    GENERAL:  He is morbidly obese and sitting in a chair slightly  discomfort due to overall situation and weakness but, he is oriented, alert and cooperative with history taking and physical examination.  HEENT:  Atraumatic. Conjunctivae pink. Oral mucosa moist. Using nasal cannula oxygen supplementation.   NECK: Supple. No JVD.   RESPIRATORY AND CARDIOVASCULAR: Examinations are limited auscultation due to morbid obesity, but grossly there are not any crackles or rhonchi heard, bilaterally equal air entry.   CARDIOVASCULAR: S1, S2 present, irregular and no murmur appreciated.   ABDOMEN: Morbidly obese, nontender. Bowel sounds heard.   SKIN: No rashes, but bilateral lower extremity severe lymphedema present and left leg infected wound present.   NEUROLOGICAL: Power 4/5 generalized weakness. Follows commands, but slight tremor. LABORATORY DATA:   BNP was 11,000.  BUN 83, creatinine 3.85, sodium 135, potassium 5.8, chloride 106, CO2 22, anion gap 7. Troponin 0.09, WBC 6.7, hemoglobin 11.7, platelet count 158, MCV 87.  pH 7.11 and pCO2 68.   ASSESSMENT AND PLAN: A 64 year old male, morbidly obese, hypertension, diabetes, hyperlipidemia and chronic obstructive pulmonary disease, came with worsening of renal function. As per the patient, last month it was normal we talked with Dr. Darreld McleanLinda Miles office.  1. Acute renal failure. Possibly this is due to infection or dehydration. Will check urinalysis and will hold any nephrotoxic drugs for now and follow tomorrow morning. Nephrology consult.  2. Hyperkalemia. There is no EKG changes, potassium level 5.8. We will give Kayexalate and follow  the levels.  3. Acidosis respiratory, his pH is 7.1 and pCO2 68. The patient is morbidly obese, this is obesity, hypoventilation syndrome. I will start patient on BiPAP and will check an ABG in the morning. We will continue him on bronchodilator therapy.  4. Chronic obstructive pulmonary disease. Continue bronchodilators and oxygen supplementation with BiPAP.  5. Infected left foot. The patient has chronic lymphedema. The patient was taking Augmentin and Cipro by primary medical doctor. We will switch it to linezolid and we will call infectious disease consult for better management of this issue.  6. Diabetes. Currently we will put on insulin with sliding scale coverage.  7. Hypertension. Blood pressure is running on slightly lower side, so we will not give any antihypertensive medication at this time.  8. Atrial fibrillation; rate is controlled by its self. No medication at this time. No anticoagulation due to history of gastrointestinal bleed in the past.   Condition is critical due to hyperkalemia, acute renal failure and acidosis respiratory failure.  We will keep on monitoring  and condition and plan discussed with the patient and his family present in the room.   TOTAL CARE TIME SPENT CRITICAL CARE: 60 minutes for this patient's management.   ____________________________ Hope Pigeon Elisabeth Pigeon, MD vgv:rw D: 12/23/2012 18:09:00 ET T: 12/23/2012 18:52:22 ET JOB#: [id number]  cc: Hope Pigeon. Elisabeth Pigeon, MD, <Dictator> Leanna Sato, MD Altamese Dilling MD ELECTRONICALLY SIGNED 01/11/2013 14:30

## 2014-10-27 NOTE — Op Note (Signed)
PATIENT NAME:  Paul [name], [name] MR#:  [id number] DATE OF BIRTH:  [birth year]  DATE OF PROCEDURE:  12/24/2012  PREOPERATIVE DIAGNOSES:  1. Respiratory insufficiency.  2. Severe acidosis.  3. Morbid obesity.  4. Acute renal failure.   POSTOPERATIVE DIAGNOSES:  1. Respiratory insufficiency.  2. Severe acidosis.  3. Morbid obesity.  4. Acute renal failure.   PROCEDURES:  1. Ultrasound guidance for vascular access, right jugular vein.  2. Placement of right jugular triple-lumen catheter.   SURGEONS: Annice NeedyJason S. Reve Crocket, M.D., and [facility] Hanne, [place]-C.   ANESTHESIA: Local.   BLOOD LOSS: 25 mL.   INDICATION FOR PROCEDURE: This is a massively obese male who was admitted to the CCU with multiple ongoing issues, including worsening respiratory insufficiency, worsening acidosis and acute renal failure. We were asked to place a central line due to his poor venous access.   DESCRIPTION OF PROCEDURE: The patient was laid flat in his critical care bed. The right neck and chest were sterilely prepped and draped. The right jugular vein was visualized on ultrasound and found to widely patent. It was then accessed under direct ultrasound guidance without difficulty with a Seldinger needle. J-wire was placed after skin nick and dilatation. The triple-lumen catheter was placed over the wire and the wire was removed. All three lumens withdrew dark red nonpulsatile blood and flushed easily with sterile saline. It was secured at 20 cm with 3 silk sutures.   ____________________________ Annice NeedyJason S. Aireanna Luellen, MD jsd:gb D: 12/24/2012 16:48:58 ET T: 12/24/2012 21:54:54 ET JOB#: [id number]  cc: Annice NeedyJason S. Zekiah Caruth, MD, <Dictator> Annice NeedyJASON S Joanie Duprey MD ELECTRONICALLY SIGNED 01/10/2013 13:20

## 2014-10-27 NOTE — Consult Note (Signed)
PATIENT NAME:  Paul [name], Paul [name] MR#:  [id number] DATE OF BIRTH:  1951-05-22  DATE OF CONSULTATION:  12/24/2012  REFERRING PHYSICIAN:  Katharina Caperima Vaickute, MD CONSULTING PHYSICIAN:  Kyrie Bun A. Kirke CorinArida, MD PRIMARY CARE PHYSICIAN: Leanna SatoLinda M. Miles, MD   REASON FOR CONSULTATION: Elevated troponin.   HISTORY OF PRESENT ILLNESS: This is a 64 year old male with past medical history of diabetes, hypertension, hyperlipidemia, morbid obesity, chronic lymphedema, chronic obstructive pulmonary disease and chronic infection of the left foot. He also has chronic atrial fibrillation with previous GI bleed and anticoagulation. He presented with tremors, weakness and decreased urine output. He was noted to be in acute on chronic renal failure. His creatinine is now above 4. He was significantly acidotic, thought to be due to hypoventilation. He denies any chest discomfort. He has chronic dyspnea with no significant worsening. His troponin was borderline elevated at 0.06 with a CK-MB of 4.3. EKG does not show any acute changes.   PAST MEDICAL HISTORY: As outlined above.   HOME MEDICATIONS: Include: 1.  Augmentin.  2.  Benzonatate 100 mg 3 times daily.  3.  Ciprofloxacin 500 mg twice daily.  4.  Colace once daily.  5.  Ferrous sulfate once daily.  6.  Furosemide 40 mg once daily as needed.  7.  Glipizide 10 mg 2 times daily.  8.  Hydrochlorothiazide/lisinopril 12.5/20 mg 2 times daily.  9.  Insulin Lantus 27 units once daily.  10.  Lyrica 75 mg once daily.  11.  Omeprazole 20 mg once daily.  12.  Xopenex as needed.   SOCIAL HISTORY: Negative for smoking, alcohol or recreational drug use.   FAMILY HISTORY: He is adopted and does not know his family history.   REVIEW OF SYSTEMS:  A 10-point review of systems was performed and is negative other than what is mentioned in the HPI.  PHYSICAL EXAMINATION: GENERAL: The patient appears to be older than his stated age, but in no acute distress.  VITAL SIGNS: Temperature  is 96.5, pulse 92, respiratory rate 22, blood pressure 117/56 and oxygen saturation is 90% on 4 liters nasal cannula.  HEENT: Normocephalic, atraumatic.  NECK: Jugular venous pressure is not visualized.  RESPIRATORY: Normal respiratory effort with no use of accessory muscles. Auscultation reveals diminished breath sounds throughout. He is on a BiPAP machine and appears to be uncomfortable.  CARDIOVASCULAR: The PMI is not palpable. He has irregularly irregular rhythm with no gallops or murmurs.  ABDOMEN: Benign, nontender, nondistended.  EXTREMITIES: With extensive lymphedema with chronic stasis dermatitis.  PSYCHIATRIC: He is in mild discomfort on the BiPAP. He appears to be oriented.   LABORATORY AND DIAGNOSTIC DATA: His creatinine is up to 4.23. Potassium is 5.8. CO2 is 23. Troponin was 0.06 with a CK-MB of 4.3. Ammonia level is 122. Liver enzymes are unremarkable. EKG showed atrial fibrillation with nonspecific ST changes.   IMPRESSION:  1.  Mildly elevated troponin, likely due to supply-demand ischemia. This is in the setting of significant underlying metabolic abnormalities. He has no convincing symptoms of angina.  2.  Acute renal failure.  3.  Significantly elevated ammonia level, likely with encephalopathy.  4.  Obesity hypoventilation syndrome.  5.  Chronic severe lymphedema of the lower extremities.  6.  Chronic atrial fibrillation not on anticoagulation due to previous GI bleed. Rate is controlled.   RECOMMENDATIONS: The patient has no convincing symptoms suggestive of a cardiac etiology. His echocardiogram was overall a difficult study, but showed normal LV systolic function. There was mild aortic  valve stenosis. The interventricular septum was flattened during systole, suggestive of right ventricular pressure overload. The pulmonary pressure was at least moderately elevated at 56 mmHg. This could be under estimated. His IVC was significantly dilated and nonreactive, suggestive of  elevated right atrial pressure. His fluid management is extremely difficult due to acute renal failure, but his right-sided pressure seems to be elevated based on the results of the echocardiogram. Based on that, I think we have to be cautious with hydration. No plans for ischemic work-up at this time given the patient's extensive comorbidities and lack of anginal symptoms. Overall prognosis seems to be poor. Will follow as needed.   ____________________________ Chelsea Aus Kirke Corin, MD maa:jm [name]: 12/24/2012 17:57:45 ET T: 12/24/2012 21:13:28 ET JOB#: [id number]  cc: Anaisa Radi A. Kirke Corin, MD, <Dictator> Katharina Caper, MD Leanna Sato, MD [facility] [place] Donovan MD ELECTRONICALLY SIGNED 12/27/2012 8:24

## 2014-10-27 NOTE — Consult Note (Signed)
   Comments   Called back to speak with pt's son. Son says that he has talked with his siblings, pt's sister, and all feel that patient would not want reintubation. Son says that he has talked with patient and pt told him that he would not want to be sustained or have his life prolonged if it was his time to go "contentment comes from God and I am content". Son feels confident that pt and all family members are comfortable with no reintubation/DNR and recognize that this may ultimately result in pt's continued decline and death. We talked about use of BIPAP prn although pt has not tolerated this before. We also talked about possible need for comfort care if pt declines. Son says he understands and agrees.  DNRcontinued supportive carebipap prncomfort care if pt declines 25 minutes  Electronic Signatures: Jonne Rote, Daryl EasternJoshua R (NP)  (Signed 30-Jun-14 12:06)  Authored: Palliative Care   Last Updated: 30-Jun-14 12:06 by Malachy MoanBorders, Candy Leverett R (NP)

## 2014-10-27 NOTE — Consult Note (Signed)
Impression:    64yo male w/ h/o lymphedema, COPD admitted with acute renal insufficiency, respiratory acidosis with hypercarbic respiratory failure and elevated ammonia levels.    I suspect that his high ammonia levels are making him more lethargic and causing hypoventilation.  He is currently on CPAP in the unit and we'll see if this can correct his acidosis.  If not, he will need intubation.  Pulmonary is to see him.   Unclear cause for his elevated ammonia.  Given his renal failure, ? toxic ingestion.  He was not found unresponsive which could cause rhabdomyolysis (and subsequent renal failure).  His CPK was normal, however.   I am not sure that there is an active infection.  He reports some chills, but he has no fever and normal WBC.  His chronic lymphedema is unchanged per his report.  He has chronic drainage.  I do not see any evidence for active infection in the legs.    Given his chills and worsening respiratory status, would continue current broad coverage.  If his BCx are negative, would stop his antibiotics.   Will try to get a sputum culture.   To get echo.   Would consider evaluation for cirrhosis given his ammonia levels. 8)    Will send serology for Hep B and Hep C.  Electronic Signatures: Yailene Badia MPH, Rosalyn GessMichael E (MD) (Signed on 20-Jun-14 13:32)  Authored   Last Updated: 20-Jun-14 13:42 by Nickayla Mcinnis MPH, Rosalyn GessMichael E (MD)

## 2014-10-29 NOTE — Discharge Summary (Signed)
PATIENT NAME:  Paul [name], Paul [name] MR#:  [id number] DATE OF BIRTH:  [birth year]  DATE OF ADMISSION:  12/17/2011 DATE OF DISCHARGE:  12/19/2011  PRESENTING COMPLAINT: Shortness of breath.   DISCHARGE DIAGNOSES:  1. Right lower lobe pneumonia.  2. Chronic respiratory failure on chronic home oxygen.  3. Morbid obesity.  4. Chronic lymphedema.  5. Hypertension.  6. Type II diabetes.  7. Obesity.   CONDITION ON DISCHARGE: Fair.   MEDICATIONS:  1. Ceftin 500 mg p.o. b.i.[name]. for seven days.  2. Zithromax 250 mg p.o. daily for four days.  3. Singulair 10 mg p.o. daily.  4. Qvar 80 mcg inhalation 1 puff b.i.[name].  5. Nexium 20 mg p.o. daily.  6. Metformin 1000 mg b.i.[name].  7. Xopenex 2 puffs inhaled every four hours.  8. Hydrochlorothiazide/lisinopril 12.5/20 1 p.o. b.i.[name].  9. DuoNebs 3 mL inhalation 4 times a day as before.  10. Lasix 40 mg p.o. daily p.r.n.  11. Lantus 50 units at bedtime.  12. Spironolactone 25 mg b.i.[name].  13. Tessalon Perles 100 mg 3 times a day as needed.  14. Cardizem CD 1 capsule at bedtime.  15. Glipizide 5 mg b.i.[name].   DIET: Low sodium, ADA diet.   FOLLOW-UP: Follow-up with Dr. Darreld McleanLinda Miles in 1 to 2 weeks.   DISCHARGE INSTRUCTIONS: Use your oxygen nebulizer as before.   LABORATORY, DIAGNOSTIC, AND RADIOLOGICAL DATA: White count 7.8, hemoglobin and hematocrit 9.8 and 30.8, platelet count 362. Cardiac enzymes negative. Blood cultures negative in 36 hours. Comprehensive metabolic panel within normal limits.   BRIEF SUMMARY OF HOSPITAL COURSE:  1. Mr. Paul [name] is a 64 year old morbidly obese Caucasian gentleman who has history of chronic respiratory failure on home oxygen secondary to COPD flare and has history of chronic lower extremity lymphedema came in with cough and shortness of breath with suspected pneumonia. He was admitted with right lower lobe pneumonia, likely cause of the patient's shortness of breath and cough. He was started on IV Rocephin and Zithromax which was  changed to Ceftin and Zithromax p.o. to complete a total course 10 days. Blood cultures have been negative. No fever. White count is normal. The patient will continue taking his nebulizer and inhalers as before. He will use his oxygen as before as well. He will follow-up with Dr. Darreld McleanLinda Miles for follow-up chest x-ray to ensure clearing of pneumonia. 2. Hypertension. Continue Cardizem and Zestril.  3. Type II diabetes. Lantus and p.o. glipizide was continued.  4. History of diastolic heart failure, appears to be euvolemic and not in heart failure. His Lasix and Aldactone were continued.  5. Chronic lymphedema. The patient is to follow-up as outpatient in the Lymphedema Clinic.  6. Gastroesophageal reflux disease. His PPIs were continued.   Hospital stay otherwise remained stable.   CODE STATUS: The patient remained a FULL CODE.   TIME SPENT: 40 minutes.   ____________________________ Wylie HailSona A. Allena KatzPatel, MD sap:drc [name]: 12/19/2011 16:48:37 ET T: 12/24/2011 14:30:32 ET JOB#: [id number]  cc: Haydyn Liddell A. Allena KatzPatel, MD, <Dictator>, Leanna SatoLinda M. Miles, MD

## 2014-10-29 NOTE — Discharge Summary (Signed)
PATIENT NAME:  Paul [name], Paul [name] MR#:  [id number] DATE OF BIRTH:  [birth year]  DATE OF ADMISSION:  12/17/2011 DATE OF DISCHARGE:  12/19/2011  PRESENTING COMPLAINT: Shortness of breath.   DISCHARGE DIAGNOSES:  1. Right lower lobe pneumonia.  2. Chronic respiratory failure on chronic home oxygen.  3. Morbid obesity.  4. Chronic lymphedema.  5. Hypertension.  6. Type 2 diabetes.    CONDITION ON DISCHARGE: Fair.   MEDICATIONS:  1. Ceftin 500 mg p.o. twice a day for seven days.  2. Zithromax 250 mg p.o. daily for four days.  3. Singulair 10 mg p.o. daily.  4. Qvar 80-mcg inhalation 1 puff b.i.[name].  5. Nexium 20 mg p.o. daily.  6. Metformin 1000 mg b.i.[name].  7. Xopenex 2 puffs inhaled every four hours.  8. Hydrochlorothiazide/lisinopril 12.5/20, 1 pill b.i.[name].  9. DuoNebs  3-mL inhalation 4 times a day as needed as before.  10. Lasix 40 mg p.o. daily p.r.n. 11. Lantus 50 units at bedtime.  12. Spironolactone 25 mg b.i.[name].  13. Tessalon Perles 100 mg 3 times a day as needed.  14. Cardizem CD 1 capsule at bedtime.  15. Glipizide 5 mg b.i.[name].   DIET: Low sodium, ADA diet.   FOLLOWUP: Follow up with Dr. Darreld McleanLinda Miles in 1 to 2 weeks.   OXYGEN: Use your oxygen nebulizer as before.   LABORATORY DATA: White count is 7.8, hemoglobin and hematocrit 9.8 and 30.8, platelet count 362. Cardiac enzymes negative. Blood cultures negative in 36 hours. Comprehensive metabolic panel within normal limits.   BRIEF SUMMARY OF HOSPITAL COURSE:  1. Mr. Paul [name] is a 64 year old morbidly obese Caucasian gentleman who has a history of chronic respiratory failure on home oxygen secondary to chronic obstructive pulmonary disease flare and has history of chronic lower extremity lymphedema, who comes in with cough and shortness of breath with suspected pneumonia. He was admitted with a right lower lobe pneumonia, likely the cause of the patient's shortness of breath and cough. He was started on IV Rocephin and Zithromax, which  was changed to Ceftin and Zithromax p.o.  He will complete a total course 10 days. Blood cultures have been negative. No fever. White count is normal. The patient will continue taking his nebulizer and inhalers as before. He will use his oxygen as before as well.  He will follow up with Dr. Darreld McleanLinda Miles for follow-up chest x-ray to ensure clearing of the pneumonia. 2. Hypertension. Continue Cardizem and Zestril.  3. Type 2 diabetes. Lantus and p.o. glipizide were continued.  4. History of diastolic heart failure, appears to be euvolemic and not in heart failure. His Lasix and Aldactone were continued.  5. Chronic lymphedema. The patient is to follow up outpatient in the lymphedema clinic.  6. Gastroesophageal reflux disease. His PPIs were continued. Hospital stay otherwise remained stable.  7. CODE STATUS: The patient remained a FULL CODE.   TIME SPENT: 40 minutes.   ____________________________ Wylie HailSona A. Allena KatzPatel, MD sap:bjt [name]: 12/19/2011 16:48:37 ET T: 12/22/2011 09:19:19 ET JOB#: [id number]  cc: Leanna SatoLinda M. Miles, MD Willow OraSONA A Jenin Birdsall MD ELECTRONICALLY SIGNED 12/27/2011 15:43

## 2014-10-29 NOTE — Consult Note (Signed)
PATIENT NAME:  Paul [name], [name] MR#:  [id number] DATE OF BIRTH:  24-Aug-1950  DATE OF CONSULTATION:  09/22/2011  REFERRING PHYSICIAN:  Herschell Dimesichard J. Renae GlossWieting, MD CONSULTING PHYSICIAN:  Lurline DelShaukat Shayla Heming, MD  REASON FOR CONSULTATION: Acute gastrointestinal bleed.   HISTORY OF PRESENT ILLNESS: This is a 64 year old male who was admitted three days ago with shaking chills and rigors. The patient was found to have cellulitis and was subsequently admitted to the hospital for further management.  During hospitalization, the patient was started on aspirin as well as subcutaneous heparin to prevent deep vein thrombosis. Last night around 11 p.m., I was called by Dr. Alford Highlandichard Wieting that patient was noted to have a couple of dark maroon bowel movements on the floor. His hemoglobin which was 11.4 earlier yesterday dropped down to 7.4 last night. He was somewhat tachycardic with a heart rate between 90 to 100. Blood pressure was somewhat low, but more than 100 systolic. The case was discussed with Dr. Renae GlossWieting, and it was decided to start the patient on IV proton pump inhibitor and transfuse him 2 units of packed RBCs stat. The patient was transferred to the Intensive Care Unit.  About an hour later, I called the Intensive Care Unit to recheck on the patient. According to the nurse he has not had any further bowel movements since transfer to the Intensive Care Unit. His blood pressure was up to 120 systolic at that point. The patient was receiving blood. It was decided to continue the resuscitation and blood transfusion with plans about performing an EGD plus/minus colonoscopy the next morning. The patient's nurse was instructed to call if there are any signs of active bleeding. Around 5:00 this morning, I was called by the patient that the patient has had 3 or 4 bloody bowel movements, and his systolic blood pressure was in the 70s at that point. The patient was receiving his second unit of blood at that point and was  receiving proton pump inhibitor drip.  The patient was immediately seen in the Intensive Care Unit two. He appeared somewhat cold and clammy. He appeared pale. The nurse was told to give the patient a bolus of 500 mL of saline before I arrived. The patient's blood pressure has improved some on saline bolus to about 82 mm systolic at that point. A bowel movement was witnessed, which appeared to be dark maroon with clots but no melena. A bleeding scan was ordered as well as a vascular surgery consultation. Plan was discussed with the patient as well as with the patient's son. At that point it appeared that we were probably dealing with diverticular bleeding, although the patient's significant hemodynamic instability was raising concern about possible upper GI bleed such as from a duodenal ulcer. Two more units of packed RBCs were ordered as well.  The patient had a bleeding scan this morning which was normal. The patient was evaluated by Dr. Wyn Quakerew and since the bleeding scan was normal, no plans were made to perform an angiogram. The patient's hemoglobin and hematocrit dropped despite 4 units of packed RBCs transfusion to 6.6.   PAST MEDICAL HISTORY: Significant for history of atrial fibrillation, congestive heart failure, history of diabetes, hypertension, lymphedema, obesity, asthma, and history of melanoma.   ALLERGIES: Codeine and Levaquin.   HOME MEDICATIONS: Include Lantus, Xopenex, lisinopril/hydrochlorothiazide, metformin, Lasix, spironolactone, aspirin, diltiazem, and glipizide.   SOCIAL HISTORY: Does not smoke or drink.   FAMILY HISTORY: Unremarkable.  REVIEW OF SYSTEMS: As above.   PHYSICAL EXAMINATION:  GENERAL: Obese male with multiple tattoos. Appeared pale, cold, clammy. Does not appear to be in any acute distress. Denies any abdominal pain.  VITAL SIGNS: His systolic blood pressure was about 82 systolic. Heart rate was around 100.   NECK: Neck veins are flat.   LUNGS: Grossly  clear to auscultation bilaterally with fair air entry and no added sounds.   CARDIOVASCULAR EXAM: Regular rate and rhythm. No gallops or murmur.   ABDOMEN: Quite benign and soft, nontender, nondistended. No rebound or guarding was noted.   EXTREMITIES: Examination showed severe cellulitis of the right lower extremity.   NEUROLOGIC: Examination appeared to be unremarkable.  LABORATORY, DIAGNOSTIC, AND RADIOLOGICAL DATA: On admission, the patient's hemoglobin was 11.8, it was 11.4 earlier yesterday and dropped down to 7.4 last night with acute bleeding. His hemoglobin was 6.6 after 4 units of packed RBC transfusion this afternoon. BUN 34, creatinine 1.03. Electrolytes are fine. INR is 1.2. Nuclear medicine scan unremarkable.   ASSESSMENT AND PLAN: The patient with significant hematochezia with significant hemodynamic instability. Possible diagnoses include acute diverticular bleeding, although bleeding scan is negative, and significant hemodynamic instability points towards a possible upper GI bleed. The patient was started on IV proton pump inhibitor drip. He has received 4 units of packed RBCs. He appears to be hemodynamically better with a systolic blood pressure of about 100. Bleeding scan is negative. Vascular surgery has already seen the patient. Due to negative bleeding scan, it was decided to proceed with an upper GI endoscopy. Upper GI endoscopy was done. Full report is in the chart. Briefly, a large gastric ulcer was noted with pigmented material in the middle, highly concerning for an underlying vessel. That ulcer was injected and cauterized with good results.  IMPRESSION: The patient with most likely duodenal ulcer bleed with significant hematochezia, hemodynamic instability, and severe anemia. The patient is status post endoscopy therapy of duodenal ulcer with good results. Diverticular bleeding remains a possibility but now unlikely based on the patient's hospital course within the last few  hours as well as endoscopy findings.  I would continue him on PPI drip. We can start him on ice chips. The plan has been discussed with Dr. Jacques Navy. Will give him two more units of packed RBC transfusion, follow hemoglobin and hematocrit. The patient may require a colonoscopy at some point. Further recommendations to follow. A small duodenal polyp was also noted during the upper GI            endoscopy, although the polyp was not removed to avoid any chances of postpolypectomy bleed, which would confuse the overall picture. Avoid nonsteroidals. We will follow.     ____________________________ Lurline Del, MD si:kma D: 09/22/2011 18:23:20 ET T: 09/23/2011 09:21:19 ET JOB#: [id number]  cc: Lurline Del, MD, <Dictator> Lurline Del MD ELECTRONICALLY SIGNED 09/24/2011 8:40

## 2014-10-29 NOTE — H&P (Signed)
PATIENT NAME:  Paul [name], Paul [name] MR#:  [id number] DATE OF BIRTH:  1951/06/04  DATE OF ADMISSION:  12/24/2011  PRIMARY CARE PHYSICIAN: Dr. Darreld McleanLinda Miles  ED REFERRING PHYSICIAN: Dr. Glenetta HewMcLaurin  CHIEF COMPLAINT: Shortness of breath, cough.   HISTORY OF PRESENT ILLNESS: Patient is a 64 year old morbidly obese male with history of chronic lymphedema, has history of diastolic heart failure, chronic atrial fibrillation, diabetes, hypertension, morbid obesity, chronic obstructive pulmonary disease who reports that he is on intermittent home oxygen. He was actually hospitalized here from 06/12 and discharged on 05/14. At that time was noted to have right lower lobe pneumonia. Patient was discharged on Ceftin and azithromycin which he is continuing to take. He reports that despite taking these he has continued to have shortness of breath, coughing. He reports that when he was discharged he was still not feeling better. He has not had any fevers or chills but has been having intermittent wheezing. He also denies any night chills. He has been having cough that has been nonproductive. Since the discharge he reports that he has been using his oxygen all the time. He otherwise denies any chest pain or palpitations. He has chronic lower extremity lymphedema that is unchanged.   PAST MEDICAL HISTORY:  1. History of hemorrhagic shock from acute gastrointestinal bleed due to a duodenal ulcer in the past.  2. History of acute renal failure.  3. Lymphedema with cellulitis.  4. Chronic heart failure.  5. Chronic atrial fibrillation.  6. Diabetes.  7. Hypertension.  8. Morbid obesity.  9. Chronic obstructive pulmonary disease, on home oxygen.  10. Diabetes type 2.  11. Gastroesophageal reflux disease.   PAST SURGICAL HISTORY: Melanoma of the chest removed.  MEDICATIONS: As per his recent discharge:  1. Ceftin 500 mg p.o. b.i.[name].  2. Azithromycin 250 p.o. daily. 3. Singulair 10 daily.  4. Q-Var 80 mcg inhalation 1 puff  b.i.[name]. Patient reports that even though he was discharged on this he could not afford this, is not using this.  5. Nexium 20 daily.  6. Metformin 1000 p.o. b.i.[name].  7. Xopenex 2 puffs inhalation q.4 hours.  8. Hydrochlorothiazide/lisinopril 12.5/20, 1 tab p.o. b.i.[name].  9. DuoNebs 4 times per day as needed.  10. Lasix 40 p.o. daily p.r.n.  11. Lantus 50 units at bedtime.  12. Spironolactone 25 mg b.i.[name]. According to patient he is using it as needed. 13. Tessalon Perles 3 times a day as needed. 14. Cardizem CD 300 at bedtime.  15. Glipizide 5 mg p.o. b.i.[name].   SOCIAL HISTORY: No smoking, alcohol drinking or illicit drugs.   FAMILY HISTORY: He is adopted, does not know about his family.   REVIEW OF SYSTEMS: CONSTITUTIONAL: Complains of chills. No fevers. No weight gain. EYES: No blurred or double vision. No pain. No redness. No inflammation. No glaucoma, cataracts. ENT: No tinnitus. No ear pain. No hearing loss. No seasonal or year-round allergies. No nasal discharge. No difficulty swallowing. RESPIRATORY: Complains of dry cough, wheezing. No hemoptysis. Has chronic dyspnea. Has chronic obstructive pulmonary disease. Has recent pneumonia. CARDIOVASCULAR: Denies any chest pain. No orthopnea. Has chronic edema. Has atrial fibrillation. Denies any syncope. GASTROINTESTINAL: No nausea, vomiting, diarrhea. No abdominal pain. No hematemesis. No melena. No ulcer. No gastroesophageal reflux disease. No irritable bowel syndrome. No jaundice. GENITOURINARY: Denies any dysuria, hematuria, renal calculus, or frequency. ENDO: Denies any polyuria, nocturia, or thyroid problems. Denies any increase in sweating, heat or cold intolerance or thirst. HEME/LYMPH: Denies anemia, easy bruisability, or bleeding. SKIN:  No acne. No rash. No change in mole, hair or skin. MUSCULOSKELETAL: Denies any pain in neck, back, or shoulder. NEURO: No numbness. No cerebrovascular accident. No transient ischemic attack. No seizures.  PSYCHIATRIC: No anxiety. No insomnia. No ADD. No OCD.   PHYSICAL EXAMINATION:  VITAL SIGNS: Temperature 97.8, pulse 77, respirations 16, blood pressure 154/75, O2 98% on 3 liters.   GENERAL: Patient is a morbidly obese male currently in mild respiratory distress.   HEENT: Head atraumatic, normocephalic. Pupils equally round, reactive to light and accommodation. There is no conjunctival pallor. No scleral icterus. Nasal exam shows no drainage or ulceration. Oropharynx is clear without any erythema.   NECK: No thyromegaly. No carotid bruits.   CARDIOVASCULAR: Regular rate and rhythm. No murmurs, rubs, clicks, or gallops. PMI is not displaced.   LUNGS: He has got occasional wheezing and some rhonchi. No rales.   ABDOMEN: Obese. Positive bowel sounds x4. There is no guarding or rebound.   EXTREMITIES: Chronic lymphedema involving the lower extremity.   SKIN: No rash.   LYMPHATICS: No lymph nodes palpable.   VASCULAR: Hard to appreciate pulses due to his severe lymphedema.    NEUROLOGICAL: Awake, alert, oriented x3. No focal deficits.   PSYCHIATRIC: Not anxious or depressed.  LABORATORY, DIAGNOSTIC AND RADIOLOGICAL DATA: Glucose 111, BUN 20, creatinine 1.06, sodium 132, potassium 4.4, chloride 94, CO2 26, calcium 9.2, WBC 10.1, hemoglobin 10.1, platelet count 337. Chest x-ray shows a right lower lobe infiltrate, possible effusion, also there is fluid in the fissures in the right lung.   ASSESSMENT AND PLAN: Patient is a morbidly obese 64 year old white male with history of diastolic congestive heart failure who was in the hospital recently was diagnosed with pneumonia who returns back with shortness of breath. Chest x-ray shows possible effusion, possible fluid in the fissure.  1. Acute on chronic respiratory failure, likely multifactorial, possible persistent pneumonia or untreated pneumonia, could be chronic obstructive pulmonary disease exacerbation, possible acute diastolic congestive  heart failure. At this time patient is unable to get a CT of his chest because of his weight. Will treat him with IV vancomycin and Zosyn. Place him on nebulizers, IV steroids and IV Lasix. Repeat a PA and lateral chest x-ray in a day or two to see if there is any improvements.  2. Chronic lymphedema. Will place him on IV Lasix. Continue p.o. spironolactone.   3. Diabetes, type 2. Will continue his Lantus as he is taking at home as well as his metformin and glipizide. Will place him on sliding scale insulin as well.  4. Hypertension. Continue lisinopril. Hold HCTZ due to IV Lasix therapy.  5. Morbid obesity.  6. Miscellaneous: Will place him on heparin for deep vein thrombosis prophylaxis.   TIME SPENT: 45 minutes.   ____________________________ Lacie Scotts Allena Katz, MD shp:cms [name]: 12/24/2011 21:38:23 ET T: 12/25/2011 04:54:09 ET JOB#: [id number]  cc: Amsi Grimley H. Allena Katz, MD, <Dictator> Leanna Sato, MD Charise Carwin MD ELECTRONICALLY SIGNED 12/29/2011 14:51

## 2014-10-29 NOTE — Consult Note (Signed)
Asked to see patient for brisk GI bleeding.  He had several episodes of bleeding with bp drop and Hgb drop.  Was seen while in nuclear medicine.  I have looked at his scan and there may be some slight tracer activity in the LLE, but does not appear that clearly positive.  The official report is pending.  If this is felt to be a positive scan, can consider embolization.  Will follow  Electronic Signatures: Annice Needyew, Danelle Curiale S (MD)  (Signed on 18-Mar-13 13:00)  Authored  Last Updated: 18-Mar-13 13:00 by Annice Needyew, Asheton Scheffler S (MD)

## 2014-10-29 NOTE — Consult Note (Signed)
Chief Complaint:   Subjective/Chief Complaint Patient feels much better with better skin color. Denies any bowel movements through the night and had a very small dark BM today. BP much better.  Impression: UGI bleed with severe blood loss anemia. Clinically there are no signs of active bleeding although hemoglobin and hematocrit remains very low.  Recommendations: Full liquid diet. Agree with further transfusion. Continue PPI. Will follow.   Electronic Signatures: Lurline DelIftikhar, Adie Vilar (MD)  (Signed 20-Mar-13 17:20)  Authored: Chief Complaint   Last Updated: 20-Mar-13 17:20 by Lurline DelIftikhar, Fong Mccarry (MD)

## 2014-10-29 NOTE — Discharge Summary (Signed)
PATIENT NAME:  Paul [name], Paul [name] MR#:  [id number] DATE OF BIRTH:  1951/07/04  DATE OF ADMISSION:  09/19/2011 DATE OF DISCHARGE:  09/26/2011  CONSULTANTS:  1. Dr. Belia Heman, pulmonary.  2. Dr. Niel Hummer, GI. 3. Dr. Wyn Quaker, vascular surgery.  4. Dr. Leavy Cella from infectious disease.   CHIEF COMPLAINT:  Rigors.   DISCHARGE DIAGNOSES:  1. Hemorrhagic shock from acute upper GI bleed from duodenal ulcer.  2. Acute renal failure. 3. Lymphedema with cellulitis.  4. Diastolic congestive heart failure.  5. Chronic atrial fibrillation.  6. Diabetes.  7. Hypertension.  8. Chronic lymphedema. 9. Obesity.  10. Asthma.   DISCHARGE MEDICATIONS:  1. Lantus 50 units at bedtime.  2. Tessalon Perles 100-mg cap daily as needed.  3. Xopenex 3 mL inhaled 3 times a day as needed. 4. Metformin 1000 mg 2 times a day.  5. Metoprolol 50 mg 2 times a day.  6. Diltiazem XL 300 mg daily.  7. Prilosec 15 mg p.o. twice daily.  8. Doxycycline 100 mg p.o. 2 times a day for four days.  9. Lasix 20 mg daily.   10. Spironolactone 25 mg, 1/2 tab daily.  11. HCTZ/lisinopril 12.5/20 mg, 1 tab daily.   DIET: GI soft, diabetic diet.   ACTIVITY: As tolerated.   REFERRALS:   1. Physical therapy 2. Outpatient lymphedema clinic.   FOLLOWUP: Please follow up with your primary care physician early next week for CBC check and follow up with Dr. Niel Hummer in about 3 to 4 weeks for followup of your duodenal ulcer.   DISPOSITION: Home.   HISTORY OF PRESENT ILLNESS: For full details, please see the History and Physical dictated on 09/19/2011 by Dr. Renae Gloss. Briefly, this is a 64 year old male with chronic lymphedema of his lower extremities who presented with low-grade fevers and rigors and was found to have leukocytosis of 18.9. The hospitalist service was contacted for evaluation for cellulitis of the lower extremities. He had relative hypotension on arrival. He was admitted to the hospitalist service.   SIGNIFICANT LABORATORY,  DIAGNOSTIC AND RADIOLOGICAL DATA: Initial BNP was 1624. Initial creatinine 1.86. Creatinine by discharge 0.9. Initial sodium 132, chloride 95. Troponin negative. TSH 1.09.  WBC on arrival 18.9, hemoglobin 11.8, hematocrit 35.3, a drop in hemoglobin from 11.4 on 03/17 2:00 a.m. to 7.5 03/17 about 10:00 p.m.  The lowest hemoglobin was 6.6 on 03/18 at about 4:30 p.m. Current hemoglobin is 8.4, hematocrit 25.2. WBC 14.8 on discharge. Initial INR was 1.2. Blood cultures: No growth to date. Urine culture: Mixed flora. Both from 03/15.  Pathology of gastric antrum cold biopsy showing negativity for H. pylori Diff-Quik stain. X-ray chest, one view, showing shallow inspiration, interstitial prominence may be pulmonary edema. Nuclear medicine bleeding scan done on 03/18 showing no evidence of acute gastrointestinal hemorrhage.   HOSPITAL COURSE:  1. Cellulitis: The patient does have chronic lymphedema and this has been going on for multiple years. The patient had pan cultures including blood and urine. He had negative evidence for pneumonia on x-ray.  It appears that the patient's leukocytosis was secondary to a possible cellulitis of his chronic lymphedema. He had some oozing, redness, and tenderness mostly in the right lower extremity. He was started on Rocephin and vancomycin and this was later changed to Zosyn and vancomycin. He did have fluctuating WBC, initially up, and then infectious disease was consulted. He was maintained on vancomycin and Zosyn. He has remained afebrile and the vancomycin and Zosyn have been switched to doxycycline. As of 03/21  he had lower WBC trend slowly. Today it is slightly up from 11.4 yesterday to 14.8 today. I discussed with Dr. Dan HumphreysWalker who would like to maintain him on doxycycline for an additional two days. He will be discharged with four more days of doxycycline and outpatient followup. He is to follow up with the lymphedema clinic with physical therapy. He is noncompliant with leg  elevation. He has also not been taking his Lasix and spironolactone. He was diuresed here.  The redness and tenderness have much improved. There was a little bit of weeping and lymphedema, persistent and chronic. We will refer him to the lymphedema clinic.  2. Upper GI bleed:  On the night of 03/17 the patient had a significant amount of bright red blood per rectum, and this continued multiple times through the night. Stat hemoglobin was obtained and the trend was significantly lower from 11.4 to 7.5. He had been on deep vein thrombosis prophylaxis and that was held. He has also been on aspirin and that was held. GI was consulted. He was taken to the Critical Care Unit. He did have significantly low blood pressure; however, he did not need pressors. He was maintained with multiple transfusions of packed red blood cells and he had a nuclear bleeding scan done which did not show any acute bleed. In total he has received 9 units of PRBC. He was taken for upper endoscopy by Dr. Niel HummerIftikhar. Vascular was also consulted. As the bleeding scan was negative the EGD was done. It did show a large duodenal ulcer with a vessel which was cauterized. He has been on a PPI b.i.d. and will be maintained on that. He has had no further GI bleeding. His blood pressure has stabilized and hemoglobin and hematocrit are slowly trending up. Today's hemoglobin is 8.4. He is to follow up with Dr. Niel HummerIftikhar in about 3 to 4 weeks for followup and he is to take his PPI twice a day. He is also to check a CBC and a hemoglobin check early next week. He will be discharged later today once he can tolerate his GI soft diet. He is on full liquids currently.  3. Acute renal failure: This was likely in the setting of low blood pressure and did improve through the hospitalization.  4. Hypertension: He was hypotensive on arrival and some of his blood pressure medications were held. Furthermore, he had significant gastrointestinal bleed and again his blood  pressure medications were held. Currently his blood pressure medications have been slowly resumed and his pressures are stable.  5. Chronic atrial fibrillation: He maintained good rate control on a calcium channel blocker. He is not on Coumadin secondary to history of nosebleeds and given his significant GI bleed he would not be a candidate for even an aspirin at the moment.  He will be discharged with calcium channel blocker and outpatient followup.  6. For his chronic lymphedema he will be discharged on Lasix 20 mg daily as well as 1/2 tablet of spironolactone.  7. At this point, once he can tolerate GI soft diet he will be discharged.   DISPOSITION: Home.   CODE STATUS: The patient is FULL CODE.   TOTAL TIME SPENT: 35 minutes.    ____________________________ Krystal EatonShayiq Ashden Sonnenberg, MD sa:bjt D: 09/26/2011 14:57:51 ET T: 09/26/2011 15:40:43 ET JOB#: [id number]  cc: Krystal EatonShayiq Laramie Meissner, MD, <Dictator> Lurline DelShaukat Iftikhar, MD Leanna SatoLinda M. Miles, MD Krystal EatonSHAYIQ Yazlynn Birkeland MD ELECTRONICALLY SIGNED 10/12/2011 15:06

## 2014-10-29 NOTE — Consult Note (Signed)
Chief Complaint:   Subjective/Chief Complaint EGD done due to significant GI hemorrhage and hemodynamic instability with negative bleeding scan. A large duodenal ulcer with pigmented material overlying a vessel. S/P thermal therapy with gold probe with excellent results.  Impression: Most likely UGI bleed from duodenal ulcer as above s/p endoscopic treatment with good results. Severe anemia. Hemodynamically more stable.  Recommendations: Continue PPI infusion. Transfuse 2 more units PRBC's. Follow H and H. Discussed with Dr. Jacques NavyAhmadzia.   Electronic Signatures: Lurline DelIftikhar, Osmani Kersten (MD)  (Signed 18-Mar-13 17:36)  Authored: Chief Complaint   Last Updated: 18-Mar-13 17:36 by Lurline DelIftikhar, Tifanny Dollens (MD)

## 2014-10-29 NOTE — Consult Note (Signed)
PATIENT NAME:  Paul [name], Calob D MR#:  [id number] DATE OF BIRTH:  06-Jul-1951  DATE OF CONSULTATION:  09/22/2011  REFERRING PHYSICIAN:   CONSULTING PHYSICIAN:  Annice NeedyJason S. Teegan Brandis, MD  REASON FOR CONSULTATION: Gastrointestinal bleed.  HISTORY OF PRESENT ILLNESS: This is a 64 year old massively obese white male who is admitted to the hospital and is in the Critical Care Unit. He has had several episodes of brisk gastrointestinal bleeding and initially his hemoglobin and blood pressure were stable. These have now both dropped as he has been bleeding for about the last 12 hours or so. He is seen while he is in the nuclear medicine holding area and again a bit later during his scan. He is not having abdominal pain. He does have a history of ulcer disease. He is not having any hematemesis or crampy abdominal pain.   PAST MEDICAL HISTORY:  1. Atrial fibrillation.  2. Congestive heart failure.  3. Diabetes.  4. Hypertension.  5. Lymphedema.  6. Morbid obesity.  7. Melanoma.  8. Peptic ulcer disease.   PAST SURGICAL HISTORY: Melanoma removal on the chest.   ALLERGIES: Codeine and Levaquin.  MEDICATIONS:  1. Lantus 50 units at night.  2. Tessalon Perles as needed.  3. Xopenex inhaler three times daily.  4. Lisinopril/HCTZ 20/12.5 mg daily.  5. Metformin 1000 mg twice a day. 6. Lasix as needed.  7. Spironolactone 1/2 tablet daily.  8. Aspirin 325 mg daily.  9. Diltiazem 300 mg daily. 10. Glipizide 10 mg twice a day.   SOCIAL HISTORY: No alcohol or tobacco use. 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   FAMILY HISTORY: He is adopted so unknown.  REVIEW OF SYSTEMS: GENERAL: Positive for fevers on admission with chills and fatigue. EYES: Positive for corrective lenses. EARS: No tinnitus or ear pain. CARDIAC: No chest pain or palpitations. RESPIRATORY: Positive for shortness of breath with exertion and chronic pulmonary disease. GI: As per history of present illness. GU: No dysuria or hematuria. ENDOCRINE: No heat  or cold intolerance. NEUROLOGIC: No transient ischemic attack, stroke, or seizure. PSYCH: No anxiety or depression. HEME: Positive for gastrointestinal bleeding. MUSCULOSKELETAL: Positive for arthritic joint pain.   PHYSICAL EXAMINATION:   GENERAL: This is a massively obese white male who is seen in the nuclear medicine area.   VITAL SIGNS: Temperature is 97.9, pulse 90, blood pressure currently is 117/52 and it was actually as low as 78/38 earlier this morning, and saturations are 99% on 2 liters nasal cannula.   HEAD: Normocephalic and atraumatic.   EYES: Sclerae are anicteric. Conjunctivae are clear.   EARS: Normal external appearance. Hearing is intact.   NECK: Supple without adenopathy or jugular venous distention.   HEART: Regular rate and rhythm.   LUNGS: Distant but clear bilaterally.   ABDOMEN: Soft, nondistended. He really does not have any tenderness to palpation.   EXTREMITIES: He has moderate lower extremity edema and stasis changes. Good capillary refill is present.   SKIN: Warm and dry.   NEUROLOGIC: Normal strength and tone in all four extremities.  LABORATORY DATA: Sodium 133, potassium 4.7, chloride 101, CO2 27, BUN 34, creatinine 1.03, and glucose 222. White blood cell count 13.9, hemoglobin 6.6, and platelet count 143,000.   ASSESSMENT AND PLAN: This is a massively obese white male with gastrointestinal bleeding. I have looked at his scan while seeing the patient. There is no clear positive tracer activity, although there may be a hint of tracer in either the left upper or left lower quadrant.  This has been difficult to discern. The radiology report is pending. Certainly, if this is felt to be from a lower gastrointestinal source embolization would be a reasonable option. If the nuclear medicine scan is negative, angiography would offer no role in the diagnosis. It would only be used for planned treatment if a source is localized. I will follow along.   This is a  level-4 consultation.  ____________________________ Annice Needy, MD jsd:slb D: 10/06/2011 13:42:48 ET T: 10/06/2011 14:57:17 ET JOB#: [id number]  cc: Annice Needy, MD, <Dictator>  Annice Needy MD ELECTRONICALLY SIGNED 10/08/2011 15:03

## 2014-10-29 NOTE — Consult Note (Signed)
PATIENT NAME:  Paul [name], Paul [name] MR#:  [id number] DATE OF BIRTH:  14-Aug-1950  DATE OF CONSULTATION:  09/22/2011  TYPE OF CONSULTATION: Infectious Disease.  REFERRING PHYSICIAN: Krystal Eaton MD  CONSULTING PHYSICIAN: Rosalyn Gess. Jacquees Gongora MD  REASON FOR CONSULTATION: Cellulitis in a patient with chronic lymphedema.   HISTORY OF PRESENT ILLNESS: The patient is a 64 year old white man with a past history significant for chronic lymphedema and diabetes, who was admitted on 03/15 with rigor and malaise. He states that he gets these every once in a while and oftentimes has to be treated with antibiotics for cellulitis of his leg. He was recently hospitalized in December for a similar episode. He came to the emergency room and was admitted. He had a slightly elevated temperature, and his white count was minimally elevated. He was started on vancomycin and Zosyn. Since admission, he has clinically improved. Blood cultures were obtained and are negative. The patient does not have any open wounds on his legs and no wound culture was available. Yesterday, he began having bright-red blood and clots per rectum. He has been seen by GI and has undergone evaluation for lower gastrointestinal bleed. Bleeding scan has been negative, and he is being prepped for a colonoscopy today. He and his son have indicated that the degree of bleeding appears to be calming down. He has received 4 units of packed red blood cells. He has not had any temperature over 100 since his hospitalization, and his white count has improved. He had some pain especially in the right leg that has improved since admission, and he has had no more rigors.   ALLERGIES: Allergies include codeine and Levaquin.   PAST MEDICAL HISTORY:  1. Chronic lymphedema. He has never been on any wraps or a lymphedema pump.  2. Atrial fibrillation.  3. Congestive heart failure.  4. Hypertension.  5. Diabetes.  6. Asthma.  7. Melanoma, status post resection.    SOCIAL HISTORY: The patient lives with a roommate. He is separated from his wife for the past year. He does not smoke. He does not drink. No history of injecting drug use. He has multiple tattoos.   FAMILY HISTORY: The patient is adopted.   REVIEW OF SYSTEMS: GENERAL: Positive fevers, chills, and malaise. HEENT: No headaches or sinus congestion. CHEST: No cough or shortness of breath. No sputum production. CARDIAC: No chest pains or palpitations. GI: No nausea, no vomiting, no abdominal pain. No change in his bowels until last night, when he began having bright-red blood and clots from below. He has had some increased fatigue since yesterday. GU: No complaints. MUSCULOSKELETAL: No complaints. SKIN: He has chronic lymphedema of the lower extremities, and chronic stasis color changes. He has had redness increasing bilaterally that has improved since hospitalization. NEUROLOGIC: No complaints. PSYCHIATRIC: Some decreased mood. All other systems are negative.   PHYSICAL EXAMINATION:   VITAL SIGNS: Maximum temperature of 99.8; current temperature of 97.6. Pulse 82. Blood pressure 81/45; 79% on 2 liters.   GENERAL: A 64 year old obese white man in no acute distress.   HEENT: Normocephalic, atraumatic. Pupils equal and reactive to light. Extraocular motion appears to be intact. Oropharynx shows no erythema or exudate. Teeth and gums are in fair condition.   NECK: Supple. Full range of motion. Midline trachea. No lymphadenopathy. No thyromegaly.   CHEST: Clear to auscultation bilaterally. Good air movement. No focal consolidation.   CARDIAC: Regular rate and rhythm, without murmur, rub, or gallop.   ABDOMEN: Soft, nontender, nondistended. No hepatosplenomegaly.  No hernia is noted.   EXTREMITIES: He has marked lymphedema below the knees bilaterally. He has some crevice formation on the right lower extremity laterally. There is erythema, more on the right than the left, that is blanching and slightly  warm. There are chronic changes consistent with venous stasis present bilaterally. There were no ulcerations present and no purulent drainage. Neither leg was weeping.   NEUROLOGIC: The patient was awake and interactive, moving all 4 extremities.   PSYCHIATRIC: Mood and affect appeared normal.   LABORATORY DATA: Laboratory data shows a BUN of 41, creatinine of 1.32, bicarbonate 24, anion gap of 12. LFTs were normal except for total bilirubin of 1.2. White count yesterday was 15.5 with a hemoglobin of 11.4, platelet count of 178, and ANC 14.0. Hemoglobin last night was 7.5. White count on admission was 10.7, but rose to as high as 18.9 on 03/16. Blood cultures from admission show no growth. Urinalysis had greater than 500 glucose, 100 mg/dL protein, negative nitrites, trace leukocyte esterase. Urine culture showed mixed bacterial flora.  A chest x-ray from 03/14 showed no infiltrates. Chest x-ray from 03/18 showed interstitial prominence, possibly consistent with pulmonary edema. Nuclear medicine bleeding scan showed no obvious evidence for hemorrhage.   IMPRESSION: A 64 year old white man with a history of chronic lymphedema and diabetes admitted with cellulitis, who has developed lower gastrointestinal bleeding.   RECOMMENDATIONS:  1. His erythema has been improving. He is on broad-spectrum antibiotics. No positive cultures are available to guide therapy. We will continue Zosyn and vancomycin for now.   2. His lower gastrointestinal bleed is being evaluated by Gastroenterology. He is to go for colonoscopy today.  3. Once he is taking p.o., will likely change to oral therapy.  4. Would recommend an outpatient PT evaluation for possible lymphedema wraps and/or lymphedema pump therapy.   This is a moderately complex infectious disease case. Thank you very much for involving me in Mr. Junio's care.     ____________________________ Rosalyn GessMichael E. Addisen Chappelle, MD meb:al D: 09/22/2011 16:37:53  ET T: 09/23/2011 09:00:33 ET JOB#: [id number]  cc: Rosalyn GessMichael E. Machael Raine, MD, <Dictator> Meridee Branum E Jocee Kissick MD ELECTRONICALLY SIGNED 09/23/2011 12:21

## 2014-10-29 NOTE — Consult Note (Signed)
Brief Consult Note: Diagnosis: GI bleed.   Patient was seen by consultant.   Discussed with Attending MD.   Comments: Called last night around 11 by Dr. Hilton SinclairWeiting. Patient had couple of dark  bowel movements. The hemoglobin which was previously 11.4 dropped to 7.5. H was hemodynamically stable. Plan was discussed with him to transfuse 2 units PRBC and start IV PPI. An our later, I called the ICU. No further bleeding was reported by the nurse and SBP was better at 120 mm. Nurse was instructed to call me if there are any signs of active bleeding. At around 5 am, I was called by the nurse that the patient had few more dark BM's and his SBP has dropped to 70. Nurse was instructed to start a fluid bolus of 500 cc. Patient was immediately seen. He feels weak but is awake and alert.  Denies any abdominal pain or nausea or vomiting. BM was seen in comoe which is dark maroon with clots. No melena. SBP is better at 82.  Impression: Acute GI bleed most likely from diverticulosis.  Recommendations: Transfuse 2 more units PRBC's stat. Bleeding scan once BP is better. Vascuar surgery consult for suspected diverticular bleed. Will follow closely.  Electronic Signatures: Lurline DelIftikhar, Devanshi Califf (MD)  (Signed [phone]-Mar-13 05:38)  Authored: Brief Consult Note   Last Updated: 18-Mar-13 05:38 by Lurline DelIftikhar, Joory Gough (MD)

## 2014-10-29 NOTE — H&P (Signed)
PATIENT NAME:  Paul [name], Paul [name] MR#:  [id number] DATE OF BIRTH:  May 30, 1951  DATE OF ADMISSION:  12/17/2011  PRIMARY CARE PHYSICIAN: Dr. Darreld Mclean  REFERRING PHYSICIAN: Dr. Glenetta Hew    CHIEF COMPLAINT: Cough, hypoxia for two days.   HISTORY OF PRESENT ILLNESS: 64 year old Caucasian male with a history of hemorrhagic shock from acute gastrointestinal bleeding due to duodenal ulcer, acute renal failure, lymphedema with cellulitis, diastolic heart failure, chronic atrial fibrillation, diabetes, hypertension, obesity, chronic obstructive pulmonary disease on home oxygen presented to ED with above chief complaint. Patient has had a cough without sputum for one week. Was diagnosed with right lower lobe pneumonia and was treated with Augmentin b.i.d. for five days, however, symptoms getting worse. Also patient had shortness of breath for the past four days. He has dyspnea on exertion. He is on home oxygen 2 liters but has to increase to 4 liters. His oxygen saturation is 88% in ED. He denies any fever, chills. No headache or dizziness. No chest pain, palpitation, orthopnea, nocturnal dyspnea. He is admitted for worsening pneumonia.   PAST MEDICAL HISTORY: As mentioned above.   SOCIAL HISTORY: No smoking, alcohol drinking or illicit drugs.   FAMILY HISTORY: He is adopted. Doesn't know about his family history.   PAST SURGICAL HISTORY: Melanoma of the chest removal.   ALLERGIES: Codeine, Levaquin.  MEDICATIONS: 1. Augmentin 875 mg/125 mg tablets 1 tablet p.o. q.12 hours.   2. Cardizem ED 300 mg p.o. daily.  3. Colace 100 mg p.o. b.i.d.  4. DuoNebs 0.5 mg/2.5 mg inhalation 3 mL q.i.d.  5. Lasix 40 mg p.o. 1 tablet p.o. daily.  6. HCTZ/lisinopril 12.5 mg/20 mg p.o. b.i.d.  7. Lantus 50 units sub-Q at bedtime. 8. Metformin 1000 mg p.o. b.i.d.  9. Nexium 20 mg p.o. daily.  10. Qvar 18 mcg inhalation 1 puff b.i.d.  11. Singulair 10 mg p.o. at bedtime.  12. Spironolactone 25 mg p.o. b.i.d.   13. Tessalon Perles 100 mg p.o. t.i.d.  14. Xopenex HFA 45 mcg inhalation 2 tablets every four hours.    REVIEW OF SYSTEMS: CONSTITUTIONAL: Patient denies any fever, chills. No headache or dizziness. No weakness or weight loss. HEENT: No blurred vision, double vision. No dysphagia, postnasal drip or epistaxis. RESPIRATORY: Positive for cough, shortness of breath but no wheezing, hemoptysis. Positive for pneumonia. CARDIOVASCULAR: No chest pain, palpitations, orthopnea. No nocturnal dyspnea. GASTROINTESTINAL: No abdominal pain, nausea, vomiting, or diarrhea. GENITOURINARY: No dysuria, hematuria. ENDOCRINE: No polyuria, polydipsia. HEMATOLOGY: No easy bruising or bleeding. SKIN: No rash or jaundice but has chronic bilateral leg lymphedema. NEUROLOGY: No syncope, loss of consciousness or seizure.   PHYSICAL EXAMINATION:  VITALS: Temperature 98.1, blood pressure 139/68, pulse 72, respirations 18, oxygen saturation 98% on oxygen 4 liters.   GENERAL: Patient is alert, awake, oriented in no acute distress.   HEENT: Pupils round, equal, reactive to light and accommodation.   NECK: Supple. No JVD or carotid bruit. No lymphadenopathy. No thyromegaly. Moist oral mucosa. Clear oropharynx.    CARDIOVASCULAR: S1, S2 irregular rate, rhythm. No murmurs, gallops.   PULMONARY: Bilateral air entry. No wheezing, rales but has right side weakness.   ABDOMEN: Obese, soft. No distention. No tenderness. It is very difficult to estimate whether patient has organomegaly due to obesity.   EXTREMITIES: Bilateral leg lymphedema. No erythema or tenderness.   SKIN: No rash or jaundice.   NEUROLOGY: Alert and oriented x3. No focal deficit. Power 5/5. Sensation intact.   LABORATORY, DIAGNOSTIC, AND RADIOLOGICAL DATA: Troponin  less than 0.02. WBC 8.6, hemoglobin 9.9, platelet count 392, electrolytes normal, BUN 15, creatinine 0.85. Chest x-ray: Right lower lobe pneumonia. EKG shows atrial fibrillation at 74 beats per  minute.   IMPRESSION:  1. Right lower lobe pneumonia.  2. Chronic obstructive pulmonary disease on home oxygen.  3. Anemia.  4. Atrial fibrillation.  5. Diabetes. 6. Hypertension.  7. Chronic lymphedema.  8. Obesity.  9. History of GI bleeding due to duodenal ulcer.  10. History of diastolic congestive heart failure.   PLAN OF TREATMENT:  1. The patient will be admitted to medical floor. Will start Zithromax, Rocephin IV and continue DuoNebs and cough medication. Continue oxygen by nasal cannula 4 liters and monitor oximetry.  2. Will get telemetry monitor for atrial fibrillation and continue home medication.  3. For diabetes we will start sliding scale, hold metformin. Continue Lantus 50 units sub-Q at bedtime. 4. GI and deep vein thrombosis prophylaxis.   Discussed the patient's situation and plan of treatment with the patient and patient's son.   TIME SPENT: About 60 minutes.   ____________________________ Shaune PollackQing Ellieana Dolecki, MD qc:cms D: 12/17/2011 20:42:51 ET T: 12/18/2011 07:42:27 ET  JOB#: [id number] cc: Shaune PollackQing Lalonnie Shaffer, MD, <Dictator> Leanna SatoLinda M. Miles, MD Shaune PollackQING Mayli Covington MD ELECTRONICALLY SIGNED 12/19/2011 19:24

## 2014-10-29 NOTE — Discharge Summary (Signed)
PATIENT NAME:  Paul [name], Stefanos D MR#:  [id number] DATE OF BIRTH:  [birth year]  DATE OF ADMISSION:  06/27/2011 DATE OF DISCHARGE:  06/30/2011  ADMITTING DIAGNOSIS: Fevers, chills, nausea, shortness of breath, palpitations, and chest pressure.   DISCHARGE DIAGNOSES:  1. Chest pain/chest pressure possibly related to atrial fibrillation with intermittent rapid ventricular response. The patient underwent a stress MIBI which was negative for ischemia or infarct, status post cardiology evaluation.  2. Atrial fibrillation. The patient was on Cardizem and has been switched to metoprolol. His heart rate is under better control. The patient's CHADS II score recommended Coumadin, however, the patient states that he has severe nasal bleeds and would rather not take Coumadin. He prefers to take aspirin. The risk of embolic cerebrovascular accident was explained to the patient.  3. Leukocytosis with intermittent fevers and chills of unclear etiology, possibly related to left lower extremity cellulitis noted during this hospitalization. The patient also has very poor dentition. He will need to have dental evaluation as an outpatient. Leukocytosis has resolved.  4. Severe diastolic dysfunction. The patient was on Lasix before, but he requested his primary care physician to change him to Aldactone due to urinating very frequently at work. We will keep Aldactone as previously. He is on HCTZ, which we will continue, and he will use Lasix as needed as previously.  5. Chronic lymphedema. Continue diuresis as before.  6. Diabetes, poorly controlled.  7. Morbid obesity.  8. History of asthma.   PERTINENT LABS/STUDIES: BMP: Glucose 204, BUN 18, creatinine 1.26, sodium 134, potassium 4.0, chloride 96, CO2 27, and calcium 9.1. Magnesium 1.4. Cholesterol 101, triglycerides 44, and HDL 48. Hemoglobin 10.4. LFTs were normal with a total bilirubin that was elevated at 1.7. Troponins were 0.03, less than 0.02, and less than 0.02. TSH  1.44. TUDs were negative. Admitting WBC count was 19.7, hemoglobin 14.2, and platelet count 157. The patient's most recent WBC count on 06/29/2011 was 9.5 and hemoglobin 11.9. His recent INR on 06/30/2011 is 1.1.   Blood cultures: No growth.  Influenza A and B were negative.   Urinalysis: Nitrite negative, leukocytes negative.   Chest x-ray, PA and lateral: Heart upper limits of normal size, slightly enlarged. Lungs fields clear.   Negative Lexiscan. LV function normal at 58. No evidence of ischemia.  Right upper quadrant ultrasound: No acute abnormality of the liver or gallbladder. Borderline splenomegaly. No abnormality of the kidneys.  CONSULTANT: Adrian BlackwaterShaukat Khan, MD   HOSPITAL COURSE: Please see history and physical done by the admitting physician. The patient is a 64 year old white male morbidly obese with chronic lymphedema who was recently diagnosed with atrial fibrillation who presented with multiple complaints. The patient had reported that he started having fevers and shakes since May 2012. He reported they usually occur after eating. He also stated that he had some chest pressure, also had some labored breathing. Due to his confluent of symptoms, he came to the emergency department. In terms of his chest pain, the patient had serial cardiac enzymes which were negative. He was seen by cardiology. They felt that his chest pain/pressure could be related to intermittent atrial fibrillation with RVR. He underwent a stress MIBI which was negative for ischemia or infarct. He was switched from Cardizem to metoprolol. He has not had any issues with chest pressure. The patient did have an echo which did show severe diastolic dysfunction likely due to long-term hypertension. He is recommended to monitor his diet and continue his diuretics as taking at home.  In terms of his atrial fibrillation, he was switched from Cardizem to metoprolol and seems to have tolerated this much better. He has not had any  further palpitations while in the hospital. He has a CHADS score of 2. He was started on Coumadin for a day. The patient was very concerned about the Coumadin. He reported that he lives alone and he has a history of heavy nasal bleeding, which he reports as severe. He stated that he did not want to take Coumadin in light of this fact. I explained to him the risk of embolic cerebrovascular accident. He understands. At this point he wants to continue aspirin. He will discuss with his primary care further if he changes his mind. In terms of his fevers and chills, the patient did have leukocytosis on presentation of unclear etiology. On further examination, he was noted to have left lower extremity swelling with erythema and warmth. He was thought to have possible cellulitis. Even before antibiotics were started for this his WBC count had started trending downward. He had no fevers. Due to the patient's history of having fevers related to eating, he underwent a right upper quadrant ultrasound which did not show any gallbladder disease. The patient also reports subsequently that he has bad teeth. On examination he does have poor dentition and will require possible extraction of some teeth which may also explain his intermittent fevers. He was checked for HIV, which was negative. He also had hepatitis A, B, and C panels which were negative. Currently the patient is doing much better and still has erythema in the left lower extremity, so I will continue the antibiotics for possible cellulitis.   DISCHARGE MEDICATIONS:  1. Lantus 50 units subcutaneous at bedtime.  2. Tessalon Perles 100 mg one tab daily as needed.  3. Xopenex inhalation three times daily as needed.  4. Lisinopril/HCTZ 12.5/25 mg p.o. daily.  5. Metformin 1000 mg p.o. twice a day. 6. Lasix p.r.n. as before.  7. Spironolactone same dose as taking at home. 8. Metoprolol tartrate 50 mg p.o. every 12 hours. 9. Amoxicillin 875 mg p.o. every 12 hours x5  days.  10. Doxycycline 100 mg p.o. twice a day x5 days. 11. Aspirin 325 mg p.o. daily.   NOTE: The patient is asked not to take Cardizem.   HOME OXYGEN: None.   DIET: Low-sodium, ADA diet.   ACTIVITY: As tolerated. The patient is prescribed a walker for some unsteadiness of his gait.            TIME FRAME FOR FOLLOWUP: Followup in 1 to 2 weeks with Dr. Darreld Mclean at the [facility].   TIME SPENT: 35 minutes. ____________________________ Lacie Scotts Allena Katz, MD shp:slb D: 06/30/2011 10:14:45 ET T: 07/02/2011 10:35:43 ET JOB#: [id number]  cc: Hlee Fringer H. Allena Katz, MD, <Dictator> Leanna Sato, MD Charise Carwin MD ELECTRONICALLY SIGNED 07/12/2011 15:06

## 2014-10-29 NOTE — H&P (Signed)
PATIENT NAME:  Paul [name], [name] MR#:  [id number] DATE OF BIRTH:  1950/07/31  DATE OF ADMISSION:  09/19/2011  PRIMARY CARE PHYSICIAN: Darreld McleanLinda Miles, MD  CHIEF COMPLAINT: I had a rigor today.  HISTORY OF PRESENT ILLNESS: This is a 64 year old gentleman who complains of a spell described as a rigor, shaking chills where he feels cold. It usually happens after eating. He cannot control the feeling. His breathing gets rapid. His blood pressure drops. His sugar drops. He had a low-grade temperature. He had some nausea and vomiting and just feels wiped out. In the Emergency Room, he was found to have a low-grade temperature on presentation, of 99.3, also found to have a slight white count of 10.7. Hospitalist services were contacted for further evaluation.   PAST MEDICAL HISTORY:  1. Atrial fibrillation.  2. Congestive heart failure. 3. Diabetes.  4. Hypertension.  5. Lymphedema.  6. Obesity.  7. Asthma.  8. History of melanoma.   PAST SURGICAL HISTORY: Melanoma of the chest removal.   ALLERGIES: Codeine and Levaquin.   MEDICATIONS:  1. Lantus 50 units subcutaneous injection at night. 2. PRN Tessalon Perles. 3. Xopenex inhalation three times daily as needed.  4. Lisinopril/HCT 20/12.5 mg 1 tablet daily.  5. Metformin 1000 mg twice a day.  6. Lasix p.r.n.  7. Spironolactone 1/2 tablet daily.  8. Aspirin 325 mg daily.  9. Diltiazem 300 mg daily.  10. Glipizide 10 mg twice a day.  SOCIAL HISTORY: No smoking. No alcohol. No drug use. 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   FAMILY HISTORY: He is adopted and does not know much about his family history.  REVIEW OF SYSTEMS: CONSTITUTIONAL: Positive for fever. Positive for chills. Positive for weight gain. Positive for fatigue. EYES: He does wear glasses. EARS, NOSE, MOUTH, AND THROAT: Positive for runny nose and had nasal cautery a few months back. CARDIOVASCULAR: No chest pain. No palpitations. RESPIRATORY: Positive for shortness of breath with exertion,  coughing up some whitish-yellow phlegm. No hemoptysis. GASTROINTESTINAL: Positive for nausea and vomiting with episode. No abdominal pain. No bright red blood per rectum. No melena. No diarrhea. No constipation. GENITOURINARY: No burning on urination or hematuria. MUSCULOSKELETAL: Positive for joint pain. INTEGUMENT: Positive for chronic lower extremity lymphedema and redness, right worse than left. NEUROLOGIC: No fainting or blackouts. PSYCHIATRIC: No anxiety or depression. ENDOCRINE: No thyroid problems. HEMATOLOGIC/LYMPHATIC: No anemia. No easy bruising or bleeding.   PHYSICAL EXAMINATION:   CURRENT VITAL SIGNS: Temperature 98.7, pulse 48, respirations 24, blood pressure 71/33, and pulse oximetry 96%. Vital signs when he presented included a temperature of 99.3, pulse 117, respirations 26, blood pressure 177/92, and pulse oximetry 93% on room air.   GENERAL: No respiratory distress, obese male sitting in bed.   EYES: Conjunctivae and lids normal. Pupils equal, round, and reactive to light. Extraocular muscles intact. No nystagmus.   EARS, NOSE, MOUTH, AND THROAT: Tympanic membranes no erythema. Nasal mucosa no erythema. Throat no erythema. No exudate seen. Lips and gums no lesions.   NECK: No JVD. No bruits. No lymphadenopathy. No thyromegaly. No thyroid nodules palpated.   LUNGS: Lungs are clear to auscultation. No use of accessory muscles to breathe. No rhonchi, rales, or wheeze heard.   HEART: S1 and S2 irregular, irregular. No gallops, rubs, or murmurs heard. Carotid upstroke 2+ bilaterally. No bruits.   EXTREMITIES: Dorsalis pedis pulses 2+ bilaterally.  4+ edema of the lower extremities.   ABDOMEN: Soft, nontender. No organomegaly/splenomegaly. Normoactive bowel sounds.   LYMPHATIC: No  lymph nodes in the neck.   MUSCULOSKELETAL: 4+ edema. No clubbing. No cyanosis.   SKIN: Chronic lower extremity erythema, worse on the right lower extremity than the left lower extremity. Warmth felt  on the right lower extremity.   NEUROLOGICAL: Cranial nerves II through XII grossly intact. Deep tendon reflexes 1+ of bilateral lower extremities.   PSYCHIATRIC: The patient is oriented to person, place, and time.   LABS/STUDIES: White blood cell count 10.7, hemoglobin and hematocrit 12.8 and 39.4, and platelet count 204. Glucose 251, BUN 21, creatinine 1.15, sodium 135, potassium 4.0, chloride 97, CO2 25, calcium 9.6, total bilirubin 1.2. Liver function tests normal. INR 1.2. Troponin negative. BNP 1624. TSH 1.09.   EKG: Atrial fibrillation at 91 beats per minute, nonspecific ST-T wave changes.   Chest x-ray negative.   ASSESSMENT AND PLAN:  1. Cellulitis with rigor, slight white count, slight fever: We will give IV Ancef at this point. Blood cultures and urine cultures sent off. We will continue to monitor the course closely.  2. Hypotension: Blood pressure was normal when he came in. We will give an IV fluid bolus. Hold lisinopril/HCT and spironolactone for right now. We will try not to hold Cardizem. Hopefully blood pressure will respond to the fluid bolus.  3. Atrial fibrillation: Currently rate controlled on Cardizem.  4. History of congestive heart failure: No current signs of congestive heart failure at this time.  5. Diabetes: The patient is on Lantus, glipizide, and metformin.  6. Obesity with a BMI of 56.8.  7. Asthma: Respiratory status stable.  8. Anemia: We will guaiac stools.   TIME SPENT ON ADMISSION: 50 minutes.  ____________________________ Herschell Dimes. Renae Gloss, MD rjw:slb D: 09/19/2011 02:53:29 ET T: 09/19/2011 07:56:22 ET JOB#: [id number]  cc: Herschell Dimes. Renae Gloss, MD, <Dictator> Leanna Sato, MD Salley Scarlet MD ELECTRONICALLY SIGNED 09/20/2011 19:09

## 2014-10-29 NOTE — Consult Note (Signed)
Chief Complaint:   Subjective/Chief Complaint Feels and looks much better although hemoglobin remains low. S/P 2 more units PRBC's. BP better. Had couple of small dark BM's.  Impression: Acute UGI bleed secondary to duodenal ulcer. No signs of active bleeding. Severe blood loss anemia, slowly improving.  Recommendations: Follow H and H. Continue PPI. Agree with clear liquids. Will follow.   VITAL SIGNS/ANCILLARY NOTES: **Vital Signs.:   19-Mar-13 20:52   Vital Signs Type Upon Transfer   Temperature Temperature (F) 98.1   Celsius 36.7   Temperature Source oral   Pulse Pulse 104   Pulse source per Dinamap   Respirations Respirations 20   Systolic BP Systolic BP 915   Diastolic BP (mmHg) Diastolic BP (mmHg) 36   Mean BP 62   BP Source Dinamap   Pulse Ox % Pulse Ox % 93   Pulse Ox Activity Level  At rest   Oxygen Delivery 2L   Nurse Fingerstick (mg/dL) FSBS (fasting range 65-99 mg/dL) 301   Comments/Interventions  Nurse Notified   Routine Chem:  19-Mar-13 03:41    Glucose, Serum 232   BUN 23   Creatinine (comp) 0.90   Sodium, Serum 139   Potassium, Serum 4.4   Chloride, Serum 101   CO2, Serum 26   Calcium (Total), Serum 7.7   Osmolality (calc) 289   eGFR (African American) >60   eGFR (Non-African American) >60   Anion Gap 12  Routine Hem:  19-Mar-13 03:41    WBC (CBC) 13.3   RBC (CBC) 2.38   Hemoglobin (CBC) 7.0   Hemoglobin (CBC) -   Hematocrit (CBC) 21.0   Platelet Count (CBC) 140   MCV 88   MCH 29.4   MCHC 33.3   RDW 15.3   Neutrophil % 81.4   Lymphocyte % 9.0   Monocyte % 7.7   Eosinophil % 1.3   Basophil % 0.6   Neutrophil # 10.9   Lymphocyte # 1.2   Monocyte # 1.0   Eosinophil # 0.2   Basophil # 0.1  TDMs:  19-Mar-13 03:41    Vancomycin, Trough LAB 21  Blood Glucose:  19-Mar-13 07:19    POCT Blood Glucose 200    11:01    POCT Blood Glucose 254  Routine Hem:  19-Mar-13 13:56    WBC (CBC) 14.8   RBC (CBC) 2.59   Hemoglobin (CBC) 7.7    Hematocrit (CBC) 22.9   Platelet Count (CBC) 169   MCV 88   MCH 29.6   MCHC 33.4   RDW 15.0   Neutrophil % 82.2   Lymphocyte % 7.6   Monocyte % 8.1   Eosinophil % 1.6   Basophil % 0.5   Neutrophil # 12.1   Lymphocyte # 1.1   Monocyte # 1.2   Eosinophil # 0.2   Basophil # 0.1    20:00    Hemoglobin (CBC) 7.9  Blood Glucose:  19-Mar-13 21:02    POCT Blood Glucose 301   Electronic Signatures: Jill Side (MD)  (Signed 19-Mar-13 22:23)  Authored: Chief Complaint, VITAL SIGNS/ANCILLARY NOTES, Lab Results   Last Updated: 19-Mar-13 22:23 by Jill Side (MD)

## 2014-10-29 NOTE — Consult Note (Signed)
Impression: 64yo WM w/ h/o chronic lymphedema, DM admitted with cellulitis who developed lower GI bleeding.  Recommendations: 1) His erythema has been improving.  He is on broad spectrum antibiotics.  No positive cultures are available to guide therapy.  Will continue zosyn and vanco for now. 2) LGIB being evaluated by GI.  To go for colonoscopy today. 3) Once he is taking po, will likely change to oral therapy. 4) Would recommend outpt PT evaluation for possible lymphedema wraps and lymphedema pump.  Electronic Signatures: Jamere Stidham, Rosalyn GessMichael E (MD)  (Signed on 18-Mar-13 16:29)  Authored  Last Updated: 18-Mar-13 16:29 by Jakhiya Brower, Rosalyn GessMichael E (MD)

## 2014-10-29 NOTE — Discharge Summary (Signed)
PATIENT NAME:  Paul [name], [name] MR#:  [id number] DATE OF BIRTH:  07/19/1950  DATE OF ADMISSION:  12/24/2011 DATE OF DISCHARGE:  12/27/2011  For a detailed note, please take a look at the history and physical done on admission by Dr. Auburn BilberryShreyang Patel.   DIAGNOSES AT DISCHARGE:  1. Acute on chronic respiratory failure secondary to congestive heart failure, likely acute on chronic systolic dysfunction. 2. Morbid obesity. 3. Chronic lower extremity lymphedema. 4. Diabetes.  5. Hypertension.  6. Recent pneumonia.   DIET: Patient is being discharged on an [employer] low sodium, low fat diet.   ACTIVITY: As tolerated.   FOLLOW UP: Follow up with Dr. Darreld McleanLinda Miles in the next 1 to 2 weeks.   DISCHARGE MEDICATIONS:   1. Singulair 10 mg daily.  2. Nexium 20 mg daily. 3. Xopenex inhaler 2 puffs every four hours as needed.  4. Hydrochlorothiazide/lisinopril 12.5/20, 1 tab b.i.d.  5. Lantus 50 units at bedtime.  6. Aldactone 25 mg b.i.d.  7. Tessalon Perles as needed.  8. Cardizem CD 300 mg daily.  9. Glipizide 5 mg b.i.d.  10. DuoNebs q.4 hours as needed.  11. Lasix 40 mg daily.  12. Prednisone 20 mg daily x1 day and then 10 mg daily x1 day, then discontinue.  13. Potassium 10 mEq daily.  14. Metformin 1000 mg b.i.d.   LABORATORY, DIAGNOSTIC AND RADIOLOGICAL DATA: Chest x-ray done on admission showing increased density in the right base most consistent with atelectasis, stable cardiomegaly.   HOSPITAL COURSE: This is a 64 year old male with medical problems as mentioned above presented to the hospital on 12/24/2011 secondary to shortness of breath and cough.  1. Acute on chronic respiratory failure. This is likely secondary to acute on chronic systolic dysfunction. Patient was recently in the hospital for treatment for pneumonia and was discharged on some p.o. Ceftin. He came back to the hospital due to shortness of breath. His chest x-ray was suggestive of suspected congestive  heart failure. Patient was started on IV diuretics, has diuresed about 7.5 liters since being in the hospital and clinically feels much better. He is on baseline oxygen at home. For now patient will finish his prednisone taper from his previous admission and start taking diuretics on a regular basis. Patient was only taking Lasix as needed. For now he will continue Lasix daily along with his Aldactone. Patient had an echocardiogram done not during this admission but during his previous admission which showed dilated cardiomyopathy with severe LV dysfunction.  2. Chronic lymphedema. Patient is to follow up with the lymphedema clinic. For now he will continue his Lasix and Aldactone.  3. Diabetes. Patient's metformin was held in the hospital but he can resume that upon discharge along with his Lantus and sliding scale insulin coverage.  4. Hypertension. Patient remained hemodynamically stable in the hospital. He will continue his lisinopril upon discharge.  5. CODE STATUS: Patient is a FULL CODE.   TIME SPENT WITH DISCHARGE: 40 minutes.  ____________________________ Rolly PancakeVivek J. Cherlynn KaiserSainani, MD vjs:cms D: 12/27/2011 13:44:34 ET T: 12/27/2011 14:09:24 ET JOB#: [id number]  cc: Rolly PancakeVivek J. Cherlynn KaiserSainani, MD, <Dictator> Leanna SatoLinda M. Miles, MD Houston SirenVIVEK J Temple Sporer MD ELECTRONICALLY SIGNED 12/29/2011 15:07
